# Patient Record
Sex: Female | Born: 1965
Health system: Southern US, Community
[De-identification: ages and names within clinical notes are randomized; demographics above are authoritative.]

## PROBLEM LIST (undated history)

## (undated) DIAGNOSIS — F329 Major depressive disorder, single episode, unspecified: Secondary | ICD-10-CM

## (undated) DIAGNOSIS — M1990 Unspecified osteoarthritis, unspecified site: Secondary | ICD-10-CM

## (undated) DIAGNOSIS — F419 Anxiety disorder, unspecified: Secondary | ICD-10-CM

## (undated) DIAGNOSIS — F32A Depression, unspecified: Secondary | ICD-10-CM

## (undated) HISTORY — DX: Anxiety disorder, unspecified: F41.9

## (undated) HISTORY — DX: Depression, unspecified: F32.A

## (undated) HISTORY — DX: Unspecified osteoarthritis, unspecified site: M19.90

## (undated) HISTORY — PX: ABDOMINAL HYSTERECTOMY: SHX81

## (undated) HISTORY — DX: Major depressive disorder, single episode, unspecified: F32.9

---

## 2002-10-06 ENCOUNTER — Encounter: Payer: Self-pay | Admitting: Family Medicine

## 2002-10-06 LAB — CONVERTED CEMR LAB

## 2005-07-14 ENCOUNTER — Ambulatory Visit: Payer: Self-pay | Admitting: Unknown Physician Specialty

## 2006-01-04 ENCOUNTER — Ambulatory Visit: Payer: Self-pay | Admitting: Family Medicine

## 2006-03-24 ENCOUNTER — Ambulatory Visit: Payer: Self-pay | Admitting: Family Medicine

## 2006-05-06 LAB — CONVERTED CEMR LAB: Pap Smear: NORMAL

## 2006-07-20 ENCOUNTER — Ambulatory Visit: Payer: Self-pay | Admitting: Unknown Physician Specialty

## 2006-08-10 ENCOUNTER — Encounter: Payer: Self-pay | Admitting: Family Medicine

## 2006-08-10 DIAGNOSIS — F32A Depression, unspecified: Secondary | ICD-10-CM | POA: Insufficient documentation

## 2006-08-10 DIAGNOSIS — F329 Major depressive disorder, single episode, unspecified: Secondary | ICD-10-CM

## 2006-08-10 DIAGNOSIS — F331 Major depressive disorder, recurrent, moderate: Secondary | ICD-10-CM | POA: Insufficient documentation

## 2006-08-10 DIAGNOSIS — F410 Panic disorder [episodic paroxysmal anxiety] without agoraphobia: Secondary | ICD-10-CM | POA: Insufficient documentation

## 2006-08-10 DIAGNOSIS — F411 Generalized anxiety disorder: Secondary | ICD-10-CM | POA: Insufficient documentation

## 2006-08-11 ENCOUNTER — Ambulatory Visit: Payer: Self-pay | Admitting: Family Medicine

## 2006-08-11 DIAGNOSIS — E785 Hyperlipidemia, unspecified: Secondary | ICD-10-CM | POA: Insufficient documentation

## 2006-11-30 ENCOUNTER — Ambulatory Visit: Payer: Self-pay | Admitting: Family Medicine

## 2006-12-05 LAB — CONVERTED CEMR LAB
ALT: 18 units/L (ref 0–35)
AST: 18 units/L (ref 0–37)
Triglycerides: 44 mg/dL (ref 0–149)
VLDL: 9 mg/dL (ref 0–40)

## 2007-09-26 ENCOUNTER — Telehealth (INDEPENDENT_AMBULATORY_CARE_PROVIDER_SITE_OTHER): Payer: Self-pay | Admitting: *Deleted

## 2007-10-01 ENCOUNTER — Ambulatory Visit: Payer: Self-pay | Admitting: Family Medicine

## 2007-10-03 ENCOUNTER — Encounter: Payer: Self-pay | Admitting: Family Medicine

## 2007-10-03 ENCOUNTER — Ambulatory Visit: Payer: Self-pay | Admitting: Family Medicine

## 2007-10-05 ENCOUNTER — Encounter (INDEPENDENT_AMBULATORY_CARE_PROVIDER_SITE_OTHER): Payer: Self-pay | Admitting: *Deleted

## 2007-10-05 ENCOUNTER — Encounter: Payer: Self-pay | Admitting: Family Medicine

## 2007-10-10 ENCOUNTER — Ambulatory Visit: Payer: Self-pay | Admitting: Family Medicine

## 2007-10-10 LAB — CONVERTED CEMR LAB
ALT: 15 units/L (ref 0–35)
Albumin: 4 g/dL (ref 3.5–5.2)
BUN: 9 mg/dL (ref 6–23)
CO2: 27 meq/L (ref 19–32)
Chloride: 103 meq/L (ref 96–112)
Cholesterol: 186 mg/dL (ref 0–200)
Creatinine, Ser: 0.9 mg/dL (ref 0.4–1.2)
GFR calc Af Amer: 88 mL/min
Glucose, Bld: 98 mg/dL (ref 70–99)
HDL: 57.4 mg/dL (ref >39.0)
Triglycerides: 48 mg/dL (ref 0–149)

## 2008-09-04 LAB — CONVERTED CEMR LAB: Pap Smear: NORMAL

## 2009-02-05 ENCOUNTER — Ambulatory Visit: Payer: Self-pay | Admitting: Family Medicine

## 2009-02-06 ENCOUNTER — Ambulatory Visit: Payer: Self-pay | Admitting: Family Medicine

## 2009-02-06 ENCOUNTER — Encounter: Payer: Self-pay | Admitting: Family Medicine

## 2009-02-09 LAB — CONVERTED CEMR LAB
ALT: 15 units/L (ref 0–35)
Basophils Relative: 0.5 % (ref 0.0–3.0)
Chloride: 108 meq/L (ref 96–112)
Cholesterol: 180 mg/dL (ref 0–200)
Eosinophils Absolute: 0.1 10*3/uL (ref 0.0–0.7)
Eosinophils Relative: 2 % (ref 0.0–5.0)
GFR calc non Af Amer: 64.06 mL/min (ref >60)
HCT: 35.1 % — ABNORMAL LOW (ref 36.0–46.0)
HDL: 60.6 mg/dL (ref >39.00)
Lymphs Abs: 2 10*3/uL (ref 0.7–4.0)
MCHC: 33.7 g/dL (ref 30.0–36.0)
MCV: 84.8 fL (ref 78.0–100.0)
Monocytes Absolute: 0.4 10*3/uL (ref 0.1–1.0)
Platelets: 348 10*3/uL (ref 150.0–400.0)
Potassium: 4.2 meq/L (ref 3.5–5.1)
Sodium: 141 meq/L (ref 135–145)
TSH: 1.27 microintl units/mL (ref 0.35–5.50)
Total Bilirubin: 0.8 mg/dL (ref 0.3–1.2)
Total Protein: 7 g/dL (ref 6.0–8.3)
VLDL: 10.6 mg/dL (ref 0.0–40.0)
WBC: 6 10*3/uL (ref 4.5–10.5)

## 2009-02-12 ENCOUNTER — Encounter (INDEPENDENT_AMBULATORY_CARE_PROVIDER_SITE_OTHER): Payer: Self-pay | Admitting: *Deleted

## 2009-06-17 ENCOUNTER — Ambulatory Visit: Payer: Self-pay | Admitting: Family Medicine

## 2009-06-17 DIAGNOSIS — J020 Streptococcal pharyngitis: Secondary | ICD-10-CM | POA: Insufficient documentation

## 2010-04-06 NOTE — Assessment & Plan Note (Signed)
Summary: ST/CLE   Vital Signs:  Patient profile:   45 year old female Height:      60.25 inches Weight:      152.25 pounds BMI:     29.59 Temp:     98.6 degrees F oral Pulse rate:   84 / minute Pulse rhythm:   regular BP sitting:   132 / 82  (left arm) Cuff size:   regular  Vitals Entered By: Delilah Shan CMA Duncan Dull) (June 17, 2009 2:27 PM) CC: ST   History of Present Illness: 45 yo with 3 days of sore throat. Felt feverish last night. No cough, runny nose, n/v/d, or other symptoms. 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   Daughter has had a sore throat this week as well.  Current Medications (verified): 1)  Paxil 40 Mg Tabs (Paroxetine Hcl) .Marland Kitchen.. 1 By Mouth Once Daily 2)  Afrin Nasal Spray 0.05 % Soln (Oxymetazoline Hcl) .... Use As Directed Prn 3)  Penicillin V Potassium 500 Mg Tabs (Penicillin V Potassium) .Marland Kitchen.. 1 Tab By Mouth Three Times A Day X 10 Days  Allergies (verified): No Known Drug Allergies  Review of Systems      See HPI General:  Complains of fever. ENT:  Complains of sore throat. Resp:  Denies cough. GI:  Denies diarrhea, nausea, and vomiting.  Physical Exam  General:  overweight but generally well appearing  Ears:  R ear normal and L ear normal.   Mouth:  pharyngeal erythema.   white exudates Lungs:  Normal respiratory effort, chest expands symmetrically. Lungs are clear to auscultation, no crackles or wheezes. Heart:  Normal rate and regular rhythm. S1 and S2 normal without gallop, murmur, click, rub or other extra sounds. Cervical Nodes:  R anterior LN tender.     Impression & Recommendations:  Problem # 1:  STREPTOCOCCAL SORE THROAT (ICD-034.0) Assessment New rapid strep positive with correlating symptoms and physical exam findings. PCN 1 tab by mouth three times a day x 10 days. Ibuprofen as needed pain. Her updated medication list for this problem includes:    Penicillin V Potassium 500 Mg Tabs (Penicillin v potassium)  .Marland Kitchen... 1 tab by mouth three times a day x 10 days  Complete Medication List: 1)  Paxil 40 Mg Tabs (Paroxetine hcl) .Marland Kitchen.. 1 by mouth once daily 2)  Afrin Nasal Spray 0.05 % Soln (Oxymetazoline hcl) .... Use as directed prn 3)  Penicillin V Potassium 500 Mg Tabs (Penicillin v potassium) .Marland Kitchen.. 1 tab by mouth three times a day x 10 days Prescriptions: PENICILLIN V POTASSIUM 500 MG TABS (PENICILLIN V POTASSIUM) 1 tab by mouth three times a day x 10 days  #30 x 0   Entered and Authorized by:   Ruthe Mannan MD   Signed by:   Ruthe Mannan MD on 06/17/2009   Method used:   Electronically to        [facility]. [address] [phone]* (retail)       [address] [place], [place]  [id number]       Ph: [phone]       Fax: [phone]   RxID:   [id number]   Current Allergies (reviewed today): No known allergies   Laboratory Results   Date/Time Reported: June 17, 2009 2:38 PM   Other Tests  Rapid Strep: positive

## 2010-06-02 ENCOUNTER — Ambulatory Visit: Payer: Self-pay | Admitting: Family Medicine

## 2011-08-11 ENCOUNTER — Ambulatory Visit: Payer: Self-pay | Admitting: Family Medicine

## 2011-09-28 ENCOUNTER — Ambulatory Visit: Payer: Self-pay | Admitting: Family Medicine

## 2013-01-14 ENCOUNTER — Ambulatory Visit: Payer: Self-pay | Admitting: Obstetrics and Gynecology

## 2013-01-14 LAB — COMPREHENSIVE METABOLIC PANEL
Albumin: 4 g/dL (ref 3.4–5.0)
Chloride: 105 mmol/L (ref 98–107)
Co2: 28 mmol/L (ref 21–32)
EGFR (Non-African Amer.): >60
Glucose: 89 mg/dL (ref 65–99)
Potassium: 4 mmol/L (ref 3.5–5.1)
SGOT(AST): 17 U/L (ref 15–37)
Total Protein: 7.5 g/dL (ref 6.4–8.2)

## 2013-01-14 LAB — CBC
HCT: 36.3 % (ref 35.0–47.0)
HGB: 12.3 g/dL (ref 12.0–16.0)
MCHC: 33.8 g/dL (ref 32.0–36.0)
RBC: 4.51 10*6/uL (ref 3.80–5.20)
WBC: 7.6 10*3/uL (ref 3.6–11.0)

## 2013-01-14 LAB — PREGNANCY, URINE: Pregnancy Test, Urine: NEGATIVE m[IU]/mL

## 2013-01-25 ENCOUNTER — Ambulatory Visit: Payer: Self-pay | Admitting: Obstetrics and Gynecology

## 2013-01-26 LAB — BASIC METABOLIC PANEL
Anion Gap: 4 — ABNORMAL LOW (ref 7–16)
BUN: 7 mg/dL (ref 7–18)
Glucose: 109 mg/dL — ABNORMAL HIGH (ref 65–99)

## 2013-01-26 LAB — CBC
MCHC: 33.2 g/dL (ref 32.0–36.0)
RDW: 13.2 % (ref 11.5–14.5)

## 2014-06-27 NOTE — Op Note (Signed)
PATIENT NAME:  Kara Mayer, [name] MR#:  [id number] DATE OF BIRTH:  Sep 14, 1965  DATE OF PROCEDURE:  01/25/2013  PREOPERATIVE DIAGNOSES:  Abnormal uterine bleeding - not otherwise specified.   PREOPERATIVE DIAGNOSIS:  Abnormal uterine bleeding - not otherwise specified.   PROCEDURES:  1.  Total laparoscopic hysterectomy.  2.  Bilateral salpingectomy.  3.  Video cystourethroscopy.   ANESTHESIA:  General.   SURGEON:  Conard NovakStephen D. Jackson, M.D.   ASSISTANT:   Annamarie MajorPaul Harris, M.D.   ESTIMATED BLOOD LOSS:  50 mL.  OPERATIVE FLUIDS:  1000 mL crystalloid.   COMPLICATIONS:  None.   FINDINGS:  1.  Normal-appearing uterus.  2.  Left fallopian tube and simple paratubal cyst.  3.  Simple-appearing cyst on left ovary.  4.  Normal-appearing right fallopian tube and ovary.   SPECIMENS:  1.  Uterus and cervix.  2.  Left and right fallopian tubes.  CONDITION AT END OF PROCEDURE:  Stable.   PROCEDURE IN DETAIL:  The patient was taken to the Operating Room where general anesthetic was administered and found to be adequate. She was placed in the dorsal supine position in the [place] stirrups and prepped and draped in the usual sterile fashion. After time-out was called, a Foley catheter was placed in her bladder and a sterile speculum was placed in the vagina and a single-tooth tenaculum was affixed to the anterior lip of the cervix. Next, a large V-Care device was affixed the cervix according to the manufacturer's recommendations after removal of the single-tooth tenaculum. The vaginal occluder balloon was also fixed to the V-Care device. After assurance the V-Care device was affixed correctly and the [facility] ring was at the vaginal fornices, the attention was then turned to the abdomen.   Attention was turned to the abdomen where a 5-mm infraumbilical incision was made and the abdomen was entered using direct visualization through an Optiview trocar technique. Entry into the abdomen was verified using opening  pressures. After insufflation of the abdomen with CO2, the camera was reintroduced through the trocar and verification atraumatic entry was undertaken and verified. The abdominal and pelvic survey was undertaken with the above-noted findings. A left lower quadrant 5-mm port was placed under direct intra-abdominal camera visualization without difficulty. A right 11-mm port was placed in the same fashion under direct intra-abdominal camera visualization. Next, the right and left ureters were identified and found to be well away from the fallopian tubes and well away from the IP ligaments. The right fallopian tube was then removed along the mesosalpinx using the LigaSure device and then the right fallopian tube was removed through the right lower quadrant trocar port. In a similar fashion, the left fallopian tube was removed after verifying the ureter was well away from the operative area of interest. The left fallopian tube was also removed through the trocar port. Attention was turned to the right adnexa where transection of the round ligament followed by the anterior broad ligament to the level of the internal os was undertaken. A bladder flap was created. The posterior broad ligament was then opened using the Harmonic device and the right uterine artery was skeletonized and at the level of the internal os it was bipolar electrocauterized using the Kleppinger device. This was then transected using the Harmonic scalpel. Attention was turned to the left adnexa where the exact same procedure was carried out on the left side without difficulty. With good effort at pushing inward on the [place] ring and V-Care device. The colpotomy was made.  This was also accomplished after insufflation of the vaginal occluder device. After the cervix was completely excised using the [facility] ring, the uterus was then removed through the vagina without difficulty. Next, the vaginal cuff was closed using #0 Vicryl using the Endo Stitch and  extracorporeal knot tying technique. Approximately 6 sutures were thrown and the vagina was digitally tested for integrity and no leakage of gas. Extra care was taken to include the vaginal epithelium.   Cystoscopy was undertaken to verify the efflux of urine from the ureters. Both ureters were noted to have efflux of yellow urine from each ureteral orifice. The rest of the bladder was inspected without any damage including the dome as well as the trigone and the lateral sides of the bladder, and the cystoscope was gently retracted to ensure that the urethra also had no evident damage. Next, the Foley catheter was replaced, and inspection again was undertaken in the abdomen and pelvis as pressure was released, there was hemostasis.   After desufflation of CO2. all port sites were removed and each skin incision was closed using a single 3-0 Vicryl on the subcutaneous area and the skin was reapproximated using Dermabond. A total of 20 mL local anesthetic was used for pain control.   The patient tolerated the procedure well. Sponge, lap, and needle counts were correct x 2. The patient received 2 grams of Ancef prior to skin incision. For VTE prophylaxis, she was wearing pneumatic compression stockings that were on and operating throughout the entire surgery. At the end of the procedure, the vagina was inspected and found to be free of any instrumentation and laparotomy sponges or anything else. The patient was awakened in the Operating Room and taken to the Recovery area in stable condition.   ____________________________ Conard Novak, MD sdj:jm D: 01/25/2013 14:59:45 ET T: 01/25/2013 15:50:42 ET JOB#: [id number]  cc: Conard Novak, MD, <Dictator> Conard Novak MD ELECTRONICALLY SIGNED 01/29/2013 18:41

## 2014-09-17 ENCOUNTER — Encounter: Payer: Self-pay | Admitting: Family Medicine

## 2014-09-17 ENCOUNTER — Ambulatory Visit (INDEPENDENT_AMBULATORY_CARE_PROVIDER_SITE_OTHER): Payer: Managed Care, Other (non HMO) | Admitting: Family Medicine

## 2014-09-17 VITALS — BP 118/70 | HR 77 | Temp 98.1°F | Ht 60.0 in | Wt 166.5 lb

## 2014-09-17 DIAGNOSIS — F329 Major depressive disorder, single episode, unspecified: Secondary | ICD-10-CM

## 2014-09-17 DIAGNOSIS — F32A Depression, unspecified: Secondary | ICD-10-CM

## 2014-09-17 DIAGNOSIS — E785 Hyperlipidemia, unspecified: Secondary | ICD-10-CM

## 2014-09-17 DIAGNOSIS — F410 Panic disorder [episodic paroxysmal anxiety] without agoraphobia: Secondary | ICD-10-CM | POA: Diagnosis not present

## 2014-09-17 DIAGNOSIS — F411 Generalized anxiety disorder: Secondary | ICD-10-CM

## 2014-09-17 NOTE — Addendum Note (Signed)
Addended by: Dianne DunARON, Tionne Dayhoff M on: 09/17/2014 11:24 AM   Modules accepted: Level of Service

## 2014-09-17 NOTE — Progress Notes (Signed)
   Subjective:   Patient ID: Kara SenterJulie P Phenix, female    DOB: 07-28-1965, 49 y.o.   MRN: [id number]  Kara Mayer is a pleasant 49 y.o. year old female who presents to clinic today with Establish Care  on 09/17/2014  HPI:  Has been seeing Dr. Dear.  Last saw her in fall of 2015- per pt GYN exam and mammogram UTD- awaiting records. Remote h/o hysterectomy.  Does still have ovaries.  Per pt, labs were done in 02/2014.  Anxiety/depression- symptoms have been well controlled on zoloft 100 mg daily.  Takes prn xanax- "maybe 2 or 3 times a month."  Denies any current symptoms of anxiety or depression. Appetite good.  Sleeps ok but has had some difficulty when work is very stressful.  No current outpatient prescriptions on file prior to visit.   No current facility-administered medications on file prior to visit.    No Known Allergies  No past medical history on file.  No past surgical history on file.  No family history on file.  History   Social History  . Marital Status: Married    Spouse Name: N/A  . Number of Children: N/A  . Years of Education: N/A   Occupational History  . Not on file.   Social History Main Topics  . Smoking status: Never Smoker   . Smokeless tobacco: Never Used  . Alcohol Use: 0.0 oz/week    0 Standard drinks or equivalent per week     Comment: on weekends  . Drug Use: No  . Sexual Activity: Not on file   Other Topics Concern  . Not on file   Social History Narrative  . No narrative on file   The PMH, PSH, Social History, Family History, Medications, and allergies have been reviewed in [facility], and have been updated if relevant.   Review of Systems  Constitutional: Negative.   HENT: Negative.   Eyes: Negative.   Respiratory: Negative.   Cardiovascular: Negative.   Gastrointestinal: Negative.   Endocrine: Negative.   Genitourinary: Negative.   Musculoskeletal: Negative.   Skin: Negative.   Allergic/Immunologic: Negative.   Neurological:  Negative.   Hematological: Negative.   Psychiatric/Behavioral: Negative.   All other systems reviewed and are negative.      Objective:    BP 118/70 mmHg  Pulse 77  Temp(Src) 98.1 F (36.7 C) (Oral)  Ht 5' (1.524 m)  Wt 166 lb 8 oz (75.524 kg)  BMI 32.52 kg/m2   Physical Exam  Constitutional: She is oriented to person, place, and time. She appears well-developed and well-nourished. No distress.  HENT:  Head: Normocephalic.  Eyes: Conjunctivae are normal.  Neck: Normal range of motion.  Cardiovascular: Normal rate, regular rhythm and normal heart sounds.   Pulmonary/Chest: Effort normal and breath sounds normal.  Musculoskeletal: She exhibits no edema.  Neurological: She is alert and oriented to person, place, and time. No cranial nerve deficit.  Skin: Skin is warm and dry.  Psychiatric: She has a normal mood and affect. Her behavior is normal. Thought content normal.  Nursing note and vitals reviewed.         Assessment & Plan:   PANIC DISORDER  HLD (hyperlipidemia)  Depression  Anxiety state No Follow-up on file.

## 2014-09-17 NOTE — Progress Notes (Signed)
Pre visit review using our clinic review tool, if applicable. No additional management support is needed unless otherwise documented below in the visit note. 

## 2014-09-17 NOTE — Assessment & Plan Note (Signed)
>>  ASSESSMENT AND PLAN FOR ANXIETY STATE WRITTEN ON 09/17/2014 11:24 AM BY Dianne Dun, MD  >25 minutes spent in face to face time with patient, >50% spent in counselling or coordination of care  PMH- anxiety, depression, HLD. Doing well on current rxs.  No changes made today.  Awaiting records.

## 2014-09-17 NOTE — Patient Instructions (Signed)
It was nice to meet you.  Once I have your records, we can decide when you are do for your physical.

## 2014-09-17 NOTE — Assessment & Plan Note (Addendum)
>  25 minutes spent in face to face time with patient, >50% spent in counselling or coordination of care  PMH- anxiety, depression, HLD. Doing well on current rxs.  No changes made today.  Awaiting records.

## 2015-01-12 ENCOUNTER — Other Ambulatory Visit: Payer: Self-pay | Admitting: *Deleted

## 2015-01-12 MED ORDER — SERTRALINE HCL 100 MG PO TABS: 100.0000 mg | ORAL_TABLET | Freq: Every day | ORAL | Status: DC

## 2015-01-12 NOTE — Telephone Encounter (Signed)
Pt recently established care 09/2014

## 2015-01-19 ENCOUNTER — Other Ambulatory Visit: Payer: Self-pay | Admitting: Family Medicine

## 2015-01-19 DIAGNOSIS — Z1231 Encounter for screening mammogram for malignant neoplasm of breast: Secondary | ICD-10-CM

## 2015-02-13 ENCOUNTER — Ambulatory Visit
Admission: RE | Admit: 2015-02-13 | Discharge: 2015-02-13 | Disposition: A | Discharge status: Home / Self Care | Payer: Managed Care, Other (non HMO) | Source: Ambulatory Visit | Attending: Family Medicine | Admitting: Family Medicine

## 2015-02-13 DIAGNOSIS — Z1231 Encounter for screening mammogram for malignant neoplasm of breast: Secondary | ICD-10-CM | POA: Diagnosis not present

## 2015-02-13 DIAGNOSIS — R921 Mammographic calcification found on diagnostic imaging of breast: Secondary | ICD-10-CM | POA: Insufficient documentation

## 2015-02-16 ENCOUNTER — Other Ambulatory Visit: Payer: Self-pay | Admitting: Family Medicine

## 2015-02-16 DIAGNOSIS — N63 Unspecified lump in unspecified breast: Secondary | ICD-10-CM

## 2015-02-16 DIAGNOSIS — R928 Other abnormal and inconclusive findings on diagnostic imaging of breast: Secondary | ICD-10-CM

## 2015-02-16 DIAGNOSIS — R921 Mammographic calcification found on diagnostic imaging of breast: Secondary | ICD-10-CM

## 2015-03-31 ENCOUNTER — Ambulatory Visit (INDEPENDENT_AMBULATORY_CARE_PROVIDER_SITE_OTHER): Payer: Managed Care, Other (non HMO) | Admitting: Primary Care

## 2015-03-31 ENCOUNTER — Encounter: Payer: Self-pay | Admitting: Primary Care

## 2015-03-31 ENCOUNTER — Other Ambulatory Visit: Payer: Self-pay | Admitting: Family Medicine

## 2015-03-31 VITALS — BP 134/82 | HR 88 | Temp 98.0°F | Ht 60.0 in | Wt 168.8 lb

## 2015-03-31 DIAGNOSIS — R05 Cough: Secondary | ICD-10-CM | POA: Diagnosis not present

## 2015-03-31 DIAGNOSIS — R059 Cough, unspecified: Secondary | ICD-10-CM

## 2015-03-31 MED ORDER — AZITHROMYCIN 250 MG PO TABS: ORAL_TABLET | ORAL | Status: DC

## 2015-03-31 MED ORDER — SERTRALINE HCL 100 MG PO TABS: 100.0000 mg | ORAL_TABLET | Freq: Every day | ORAL | Status: DC

## 2015-03-31 MED ORDER — ALPRAZOLAM 0.25 MG PO TABS: ORAL_TABLET | ORAL | Status: DC

## 2015-03-31 MED ORDER — BENZONATATE 200 MG PO CAPS: 200.0000 mg | ORAL_CAPSULE | Freq: Three times a day (TID) | ORAL | Status: DC | PRN

## 2015-03-31 NOTE — Telephone Encounter (Signed)
Pt has not seen Dr Dayton Martes since establishing in 09/2014

## 2015-03-31 NOTE — Progress Notes (Signed)
   Subjective:    Patient ID: Kara Mayer, female    DOB: 05/09/1965, 50 y.o.   MRN: [id number]  HPI  Kara Mayer is a 50 year old female who presents today with a chief complaint of cough. She also reports fever, headache, chills, nasal congestion. Her symptoms have been present for the past 3 days. She noticed sinus pressure since Wednesday last week which has since improved. Her cough is non productive. She's taken sudafed, Robitussin, Mucinex severe cold, and motrin without improvement. Her most bothersome symptom is the cough. Denies sick contacts.  Review of Systems  Constitutional: Positive for fever, chills and fatigue.  HENT: Positive for congestion and sore throat. Negative for ear pain.   Respiratory: Positive for cough and shortness of breath. Negative for wheezing.   Cardiovascular: Negative for chest pain.  Gastrointestinal: Negative for nausea.  Musculoskeletal: Positive for myalgias.  Neurological: Positive for headaches.       No past medical history on file.  Social History   Social History  . Marital Status: Married    Spouse Name: N/A  . Number of Children: N/A  . Years of Education: N/A   Occupational History  . Not on file.   Social History Main Topics  . Smoking status: Never Smoker   . Smokeless tobacco: Never Used  . Alcohol Use: 0.0 oz/week    0 Standard drinks or equivalent per week     Comment: on weekends  . Drug Use: No  . Sexual Activity: Not on file   Other Topics Concern  . Not on file   Social History Narrative    No past surgical history on file.  No family history on file.  No Known Allergies  No current outpatient prescriptions on file prior to visit.   No current facility-administered medications on file prior to visit.    BP 134/82 mmHg  Pulse 88  Temp(Src) 98 F (36.7 C) (Oral)  Ht 5' (1.524 m)  Wt 168 lb 12.8 oz (76.567 kg)  BMI 32.97 kg/m2  SpO2 95%    Objective:   Physical Exam  Constitutional: She  appears well-nourished.  HENT:  Right Ear: Tympanic membrane and ear canal normal.  Left Ear: Tympanic membrane and ear canal normal.  Nose: Right sinus exhibits no maxillary sinus tenderness and no frontal sinus tenderness. Left sinus exhibits no maxillary sinus tenderness and no frontal sinus tenderness.  Mouth/Throat: Oropharynx is clear and moist.  Neck: Neck supple.  Cardiovascular: Normal rate and regular rhythm.   Pulmonary/Chest: Effort normal. She has no decreased breath sounds. She has no wheezes.  Crackles to left base  Skin: Skin is warm and dry.          Assessment & Plan:  Cough:  Cough x 3 days with fatigue, fevers, SOB. Sinus symptoms 1 week ago. Now feeling worse. Exam with crackles to left lower lobe, appears tired.  Due to fevers over 101, crackles to lung base, and fatigue, will treat for possible bacterial involvement. RX for Zpak and tessalon pearls sent to pharmacy. Fluids, rest, Nyquil HS. Return precautions provided.

## 2015-03-31 NOTE — Telephone Encounter (Signed)
Pt needs refill on Zoloft and Xanax. Please advise  [facility] on [address] in [place]  [phone]

## 2015-03-31 NOTE — Telephone Encounter (Signed)
Please enter as refill request. 

## 2015-03-31 NOTE — Progress Notes (Signed)
Pre visit review using our clinic review tool, if applicable. No additional management support is needed unless otherwise documented below in the visit note. 

## 2015-03-31 NOTE — Patient Instructions (Signed)
Start Azithromycin antibiotics. Take 2 tablets by mouth today, then 1 tablet daily for 4 additional days.  You may take Benzonatate capsules for cough. Take 1 capsule by mouth three times daily as needed for cough.  Ensure that you are staying hydrated with water. Continue Mucinex for any chest congestion, ibuprofen for headaches/fevers.  Please call me if no improvement in 3-4 days or if you symptoms become worse.  It was a pleasure meeting you!

## 2015-03-31 NOTE — Telephone Encounter (Signed)
Rx called in to requested pharmacy 

## 2015-04-09 ENCOUNTER — Ambulatory Visit
Admission: RE | Admit: 2015-04-09 | Discharge: 2015-04-09 | Disposition: A | Discharge status: Home / Self Care | Payer: Managed Care, Other (non HMO) | Source: Ambulatory Visit | Attending: Family Medicine | Admitting: Family Medicine

## 2015-04-09 DIAGNOSIS — N63 Unspecified lump in unspecified breast: Secondary | ICD-10-CM

## 2015-04-09 DIAGNOSIS — R928 Other abnormal and inconclusive findings on diagnostic imaging of breast: Secondary | ICD-10-CM

## 2015-04-09 DIAGNOSIS — R921 Mammographic calcification found on diagnostic imaging of breast: Secondary | ICD-10-CM

## 2015-07-23 ENCOUNTER — Other Ambulatory Visit: Payer: Self-pay | Admitting: Orthopedic Surgery

## 2015-07-23 DIAGNOSIS — M5416 Radiculopathy, lumbar region: Secondary | ICD-10-CM

## 2015-07-23 DIAGNOSIS — M4726 Other spondylosis with radiculopathy, lumbar region: Secondary | ICD-10-CM

## 2015-07-31 ENCOUNTER — Ambulatory Visit: Payer: Managed Care, Other (non HMO)

## 2015-12-21 ENCOUNTER — Other Ambulatory Visit: Payer: Self-pay | Admitting: Orthopedic Surgery

## 2015-12-21 DIAGNOSIS — M4726 Other spondylosis with radiculopathy, lumbar region: Secondary | ICD-10-CM

## 2015-12-21 DIAGNOSIS — M5416 Radiculopathy, lumbar region: Secondary | ICD-10-CM

## 2015-12-31 ENCOUNTER — Encounter (INDEPENDENT_AMBULATORY_CARE_PROVIDER_SITE_OTHER): Payer: Self-pay

## 2015-12-31 ENCOUNTER — Ambulatory Visit
Admission: RE | Admit: 2015-12-31 | Discharge: 2015-12-31 | Disposition: A | Discharge status: Home / Self Care | Payer: 59 | Source: Ambulatory Visit | Attending: Orthopedic Surgery | Admitting: Orthopedic Surgery

## 2015-12-31 DIAGNOSIS — M4726 Other spondylosis with radiculopathy, lumbar region: Secondary | ICD-10-CM | POA: Insufficient documentation

## 2015-12-31 DIAGNOSIS — M5416 Radiculopathy, lumbar region: Secondary | ICD-10-CM

## 2015-12-31 DIAGNOSIS — M5116 Intervertebral disc disorders with radiculopathy, lumbar region: Secondary | ICD-10-CM | POA: Insufficient documentation

## 2016-02-24 ENCOUNTER — Other Ambulatory Visit: Payer: Self-pay | Admitting: Family Medicine

## 2016-02-24 DIAGNOSIS — E785 Hyperlipidemia, unspecified: Secondary | ICD-10-CM

## 2016-02-24 DIAGNOSIS — Z01419 Encounter for gynecological examination (general) (routine) without abnormal findings: Secondary | ICD-10-CM | POA: Insufficient documentation

## 2016-03-01 ENCOUNTER — Other Ambulatory Visit (INDEPENDENT_AMBULATORY_CARE_PROVIDER_SITE_OTHER): Payer: 59

## 2016-03-01 DIAGNOSIS — Z01419 Encounter for gynecological examination (general) (routine) without abnormal findings: Secondary | ICD-10-CM | POA: Diagnosis not present

## 2016-03-01 LAB — CBC WITH DIFFERENTIAL/PLATELET
BASOS PCT: 0.4 % (ref 0.0–3.0)
Basophils Absolute: 0 10*3/uL (ref 0.0–0.1)
EOS ABS: 0.2 10*3/uL (ref 0.0–0.7)
Eosinophils Relative: 3.5 % (ref 0.0–5.0)
HEMATOCRIT: 36.6 % (ref 36.0–46.0)
HEMOGLOBIN: 12.4 g/dL (ref 12.0–15.0)
LYMPHS PCT: 35.8 % (ref 12.0–46.0)
Lymphs Abs: 2.4 10*3/uL (ref 0.7–4.0)
MCHC: 33.8 g/dL (ref 30.0–36.0)
MCV: 80.6 fl (ref 78.0–100.0)
Monocytes Absolute: 0.4 10*3/uL (ref 0.1–1.0)
Monocytes Relative: 5.7 % (ref 3.0–12.0)
NEUTROS ABS: 3.7 10*3/uL (ref 1.4–7.7)
Neutrophils Relative %: 54.6 % (ref 43.0–77.0)
PLATELETS: 369 10*3/uL (ref 150.0–400.0)
RBC: 4.55 Mil/uL (ref 3.87–5.11)
RDW: 13.5 % (ref 11.5–15.5)
WBC: 6.8 10*3/uL (ref 4.0–10.5)

## 2016-03-01 LAB — COMPREHENSIVE METABOLIC PANEL
ALBUMIN: 4.2 g/dL (ref 3.5–5.2)
ALT: 18 U/L (ref 0–35)
AST: 25 U/L (ref 0–37)
Alkaline Phosphatase: 58 U/L (ref 39–117)
BUN: 16 mg/dL (ref 6–23)
CALCIUM: 9 mg/dL (ref 8.4–10.5)
CHLORIDE: 105 meq/L (ref 96–112)
CO2: 23 meq/L (ref 19–32)
CREATININE: 0.9 mg/dL (ref 0.40–1.20)
GFR: 70.18 mL/min (ref >60.00)
Glucose, Bld: 105 mg/dL — ABNORMAL HIGH (ref 70–99)
Sodium: 140 mEq/L (ref 135–145)
Total Bilirubin: 0.3 mg/dL (ref 0.2–1.2)
Total Protein: 7.2 g/dL (ref 6.0–8.3)

## 2016-03-01 LAB — LIPID PANEL
CHOL/HDL RATIO: 4
CHOLESTEROL: 190 mg/dL (ref 0–200)
HDL: 53 mg/dL (ref >39.00)
LDL CALC: 116 mg/dL — AB (ref 0–99)
NonHDL: 136.83
TRIGLYCERIDES: 106 mg/dL (ref 0.0–149.0)
VLDL: 21.2 mg/dL (ref 0.0–40.0)

## 2016-03-01 LAB — TSH: TSH: 2.9 u[IU]/mL (ref 0.35–4.50)

## 2016-03-07 HISTORY — PX: REDUCTION MAMMAPLASTY: SUR839

## 2016-03-08 ENCOUNTER — Ambulatory Visit (INDEPENDENT_AMBULATORY_CARE_PROVIDER_SITE_OTHER): Payer: 59 | Admitting: Family Medicine

## 2016-03-08 ENCOUNTER — Encounter: Payer: Self-pay | Admitting: Family Medicine

## 2016-03-08 VITALS — BP 136/82 | HR 72 | Temp 98.5°F | Ht 60.0 in | Wt 170.2 lb

## 2016-03-08 DIAGNOSIS — F410 Panic disorder [episodic paroxysmal anxiety] without agoraphobia: Secondary | ICD-10-CM

## 2016-03-08 DIAGNOSIS — Z01419 Encounter for gynecological examination (general) (routine) without abnormal findings: Secondary | ICD-10-CM

## 2016-03-08 DIAGNOSIS — M5136 Other intervertebral disc degeneration, lumbar region: Secondary | ICD-10-CM

## 2016-03-08 DIAGNOSIS — Z0001 Encounter for general adult medical examination with abnormal findings: Secondary | ICD-10-CM

## 2016-03-08 DIAGNOSIS — F411 Generalized anxiety disorder: Secondary | ICD-10-CM

## 2016-03-08 DIAGNOSIS — E785 Hyperlipidemia, unspecified: Secondary | ICD-10-CM

## 2016-03-08 MED ORDER — SERTRALINE HCL 100 MG PO TABS: 100.0000 mg | ORAL_TABLET | Freq: Every day | ORAL | 3 refills | Status: DC

## 2016-03-08 MED ORDER — ALPRAZOLAM 0.25 MG PO TABS: ORAL_TABLET | ORAL | 0 refills | Status: DC

## 2016-03-08 NOTE — Patient Instructions (Signed)
Great to see you. Happy New Year!  Please call to schedule your mammogram.  Please consider colonscopy.

## 2016-03-08 NOTE — Assessment & Plan Note (Signed)
>>  ASSESSMENT AND PLAN FOR ANXIETY STATE WRITTEN ON 03/08/2016  9:34 AM BY Dianne Dun, MD  Deteriorated because ran out of zoloft. eRx refills sent. Rx for xanax printed and given to pt.

## 2016-03-08 NOTE — Progress Notes (Signed)
Pre visit review using our clinic review tool, if applicable. No additional management support is needed unless otherwise documented below in the visit note. 

## 2016-03-08 NOTE — Assessment & Plan Note (Signed)
Reviewed preventive care protocols, scheduled due services, and updated immunizations Discussed nutrition, exercise, diet, and healthy lifestyle.   Discussed colonoscopy - pt will think about it and get back to me.

## 2016-03-08 NOTE — Assessment & Plan Note (Signed)
Deteriorated because ran out of zoloft. eRx refills sent. Rx for xanax printed and given to pt.

## 2016-03-08 NOTE — Progress Notes (Signed)
Subjective:   Patient ID: Kara Mayer, female    DOB: 1965/12/10, 51 y.o.   MRN: [id number]  Kara Mayer is a pleasant 51 y.o. year old female who presents to clinic today with Annual Exam and Back Pain (sees ortho)  and follow up of chronic medical conditions on 03/08/2016  HPI:  Remote h/o hysterectomy.  Mammogram 04/09/15  DDD of spine- followed by Dr. Rosita Kea.  Last saw him on 01/26/16. Notes reviewed. Given an rx for tizanidine and referred to PT. Saw Dr. Yves Dill on 02/10/16.  Notes reviewed.  Given epidural injections at that OV. She is scheduled to have another injection this Friday.  Anxiety/depression- symptoms had been well controlled on zoloft 100 mg dail but ran out a few weeks ago.  Has been much more tearful. .  Takes prn xanax- "maybe 2 or 3 times a month."  Denies any current symptoms of anxiety or depression. Appetite good.  Sleeps ok but has had some difficulty when work is very stressful.  Lab Results  Component Value Date   CHOL 190 03/01/2016   HDL 53.00 03/01/2016   LDLCALC 116 (H) 03/01/2016   TRIG 106.0 03/01/2016   CHOLHDL 4 03/01/2016   Lab Results  Component Value Date   CREATININE 0.90 03/01/2016   Lab Results  Component Value Date   TSH 2.90 03/01/2016   Lab Results  Component Value Date   WBC 6.8 03/01/2016   HGB 12.4 03/01/2016   HCT 36.6 03/01/2016   MCV 80.6 03/01/2016   PLT 369.0 03/01/2016   Lab Results  Component Value Date   NA 140 03/01/2016   K 4.2 Hemolyzed 03/01/2016   CL 105 03/01/2016   CO2 23 03/01/2016   Lab Results  Component Value Date   ALT 18 03/01/2016   AST 25 03/01/2016   ALKPHOS 58 03/01/2016   BILITOT 0.3 03/01/2016   No current outpatient prescriptions on file prior to visit.   No current facility-administered medications on file prior to visit.     No Known Allergies  No past medical history on file.  No past surgical history on file.  No family history on file.  Social History   Social  History  . Marital status: Married    Spouse name: N/A  . Number of children: N/A  . Years of education: N/A   Occupational History  . Not on file.   Social History Main Topics  . Smoking status: Never Smoker  . Smokeless tobacco: Never Used  . Alcohol use 0.0 oz/week     Comment: on weekends  . Drug use: No  . Sexual activity: Not on file   Other Topics Concern  . Not on file   Social History Narrative  . No narrative on file   The PMH, PSH, Social History, Family History, Medications, and allergies have been reviewed in [facility], and have been updated if relevant.   Review of Systems  Constitutional: Negative.   HENT: Negative.   Eyes: Negative.   Respiratory: Negative.   Cardiovascular: Negative.   Gastrointestinal: Negative.   Endocrine: Negative.   Genitourinary: Negative.   Musculoskeletal: Positive for back pain. Negative for gait problem, joint swelling and myalgias.  Skin: Negative.   Allergic/Immunologic: Negative.   Neurological: Negative.   Hematological: Negative.   Psychiatric/Behavioral: Positive for dysphoric mood. The patient is nervous/anxious.   All other systems reviewed and are negative.      Objective:    BP 136/82   Pulse  72   Temp 98.5 F (36.9 C) (Oral)   Ht 5' (1.524 m)   Wt 170 lb 4 oz (77.2 kg)   SpO2 99%   BMI 33.25 kg/m    Physical Exam   General:  Well-developed,well-nourished,in no acute distress; alert,appropriate and cooperative throughout examination Head:  normocephalic and atraumatic.   Eyes:  vision grossly intact, PERRL Ears:  R ear normal and L ear normal externally, TMs clear bilaterally Nose:  no external deformity.   Mouth:  good dentition.   Neck:  No deformities, masses, or tenderness noted. Breasts:  No mass, nodules, thickening, tenderness, bulging, retraction, inflamation, nipple discharge or skin changes noted.   Lungs:  Normal respiratory effort, chest expands symmetrically. Lungs are clear to  auscultation, no crackles or wheezes. Heart:  Normal rate and regular rhythm. S1 and S2 normal without gallop, murmur, click, rub or other extra sounds. Abdomen:  Bowel sounds positive,abdomen soft and non-tender without masses, organomegaly or hernias noted. Msk:  No deformity or scoliosis noted of thoracic or lumbar spine.   Extremities:  No clubbing, cyanosis, edema, or deformity noted with normal full range of motion of all joints.   Neurologic:  alert & oriented X3 and gait normal.   Skin:  Intact without suspicious lesions or rashes Cervical Nodes:  No lymphadenopathy noted Axillary Nodes:  No palpable lymphadenopathy Psych:  Cognition and judgment appear intact. Alert and cooperative with normal attention span and concentration. No apparent delusions, illusions, hallucinations       Assessment & Plan:   Well woman exam  PANIC DISORDER  Hyperlipidemia, unspecified hyperlipidemia type  Anxiety state  DDD (degenerative disc disease), lumbar No Follow-up on file.

## 2016-03-08 NOTE — Assessment & Plan Note (Signed)
Followed by Dr. Rosita KeaMenz and Dr. Yves Dillhasnis.

## 2016-03-11 DIAGNOSIS — M5136 Other intervertebral disc degeneration, lumbar region: Secondary | ICD-10-CM | POA: Diagnosis not present

## 2016-03-11 DIAGNOSIS — M5416 Radiculopathy, lumbar region: Secondary | ICD-10-CM | POA: Diagnosis not present

## 2016-03-15 DIAGNOSIS — M542 Cervicalgia: Secondary | ICD-10-CM | POA: Diagnosis not present

## 2016-04-18 DIAGNOSIS — M5126 Other intervertebral disc displacement, lumbar region: Secondary | ICD-10-CM | POA: Diagnosis not present

## 2016-04-18 DIAGNOSIS — M5416 Radiculopathy, lumbar region: Secondary | ICD-10-CM | POA: Diagnosis not present

## 2016-04-18 DIAGNOSIS — M4726 Other spondylosis with radiculopathy, lumbar region: Secondary | ICD-10-CM | POA: Diagnosis not present

## 2016-05-18 ENCOUNTER — Telehealth: Payer: Self-pay

## 2016-05-18 DIAGNOSIS — M545 Low back pain: Secondary | ICD-10-CM

## 2016-05-18 DIAGNOSIS — Z08 Encounter for follow-up examination after completed treatment for malignant neoplasm: Secondary | ICD-10-CM

## 2016-05-18 DIAGNOSIS — G8929 Other chronic pain: Secondary | ICD-10-CM

## 2016-05-18 DIAGNOSIS — Z86000 Personal history of in-situ neoplasm of breast: Principal | ICD-10-CM

## 2016-05-18 DIAGNOSIS — N62 Hypertrophy of breast: Secondary | ICD-10-CM

## 2016-05-18 NOTE — Telephone Encounter (Signed)
Patient is requesting a referral for breast reduction to [facility] due to back pain for over 20 years. She has an appointment coming up with Graylin ShiverGregory S. Barbaraann CaoGeorgiade, MD  Plastic and Reconstructive Surgeon [phone]:[phone]

## 2016-05-18 NOTE — Telephone Encounter (Signed)
Referral placed.

## 2016-05-27 ENCOUNTER — Telehealth: Payer: Self-pay

## 2016-05-27 DIAGNOSIS — N62 Hypertrophy of breast: Secondary | ICD-10-CM | POA: Diagnosis not present

## 2016-05-27 MED ORDER — OSELTAMIVIR PHOSPHATE 75 MG PO CAPS: 75.0000 mg | ORAL_CAPSULE | Freq: Every day | ORAL | 0 refills | Status: DC

## 2016-05-27 NOTE — Telephone Encounter (Signed)
Tamiflu sent to pharmacy

## 2016-05-27 NOTE — Telephone Encounter (Signed)
Pt left v/m; pt had flu shot but pts daughter dx with the type A flu; pts daughter has fever and vomiting;pt request preventative dosing of tamiflu to [facility] [address]annual exam on 03/08/16.

## 2016-05-27 NOTE — Addendum Note (Signed)
Addended by: Lorre MunroeBAITY, Traven Davids W on: 05/27/2016 04:48 PM   Modules accepted: Orders

## 2016-06-02 ENCOUNTER — Other Ambulatory Visit: Payer: Self-pay | Admitting: Family Medicine

## 2016-06-02 DIAGNOSIS — Z1231 Encounter for screening mammogram for malignant neoplasm of breast: Secondary | ICD-10-CM

## 2016-06-03 ENCOUNTER — Ambulatory Visit
Admission: RE | Admit: 2016-06-03 | Discharge: 2016-06-03 | Disposition: A | Discharge status: Home / Self Care | Payer: 59 | Source: Ambulatory Visit | Attending: Family Medicine | Admitting: Family Medicine

## 2016-06-03 DIAGNOSIS — Z1231 Encounter for screening mammogram for malignant neoplasm of breast: Secondary | ICD-10-CM | POA: Insufficient documentation

## 2016-06-03 DIAGNOSIS — N6489 Other specified disorders of breast: Secondary | ICD-10-CM | POA: Insufficient documentation

## 2016-08-10 DIAGNOSIS — M5136 Other intervertebral disc degeneration, lumbar region: Secondary | ICD-10-CM | POA: Diagnosis not present

## 2016-08-10 DIAGNOSIS — M5416 Radiculopathy, lumbar region: Secondary | ICD-10-CM | POA: Diagnosis not present

## 2016-10-03 ENCOUNTER — Encounter: Payer: Self-pay | Admitting: Family Medicine

## 2016-10-03 ENCOUNTER — Ambulatory Visit (INDEPENDENT_AMBULATORY_CARE_PROVIDER_SITE_OTHER): Payer: 59 | Admitting: Family Medicine

## 2016-10-03 VITALS — BP 120/86 | HR 76 | Temp 98.3°F | Ht 60.0 in | Wt 169.5 lb

## 2016-10-03 DIAGNOSIS — Z01818 Encounter for other preprocedural examination: Secondary | ICD-10-CM | POA: Diagnosis not present

## 2016-10-03 LAB — CBC WITH DIFFERENTIAL/PLATELET
BASOS ABS: 0.1 10*3/uL (ref 0.0–0.1)
BASOS PCT: 0.7 % (ref 0.0–3.0)
EOS ABS: 0.2 10*3/uL (ref 0.0–0.7)
Eosinophils Relative: 2.4 % (ref 0.0–5.0)
HEMATOCRIT: 37.4 % (ref 36.0–46.0)
HEMOGLOBIN: 12.4 g/dL (ref 12.0–15.0)
LYMPHS PCT: 34.1 % (ref 12.0–46.0)
Lymphs Abs: 2.4 10*3/uL (ref 0.7–4.0)
MCHC: 33.2 g/dL (ref 30.0–36.0)
MCV: 82.8 fl (ref 78.0–100.0)
Monocytes Absolute: 0.5 10*3/uL (ref 0.1–1.0)
Monocytes Relative: 6.6 % (ref 3.0–12.0)
NEUTROS ABS: 3.9 10*3/uL (ref 1.4–7.7)
Neutrophils Relative %: 56.2 % (ref 43.0–77.0)
PLATELETS: 416 10*3/uL — AB (ref 150.0–400.0)
RBC: 4.52 Mil/uL (ref 3.87–5.11)
RDW: 13.4 % (ref 11.5–15.5)
WBC: 6.9 10*3/uL (ref 4.0–10.5)

## 2016-10-03 LAB — COMPREHENSIVE METABOLIC PANEL
ALT: 15 U/L (ref 0–35)
AST: 17 U/L (ref 0–37)
Albumin: 4.4 g/dL (ref 3.5–5.2)
Alkaline Phosphatase: 53 U/L (ref 39–117)
BUN: 11 mg/dL (ref 6–23)
CALCIUM: 9.2 mg/dL (ref 8.4–10.5)
CHLORIDE: 104 meq/L (ref 96–112)
CO2: 26 meq/L (ref 19–32)
Creatinine, Ser: 0.83 mg/dL (ref 0.40–1.20)
GFR: 76.87 mL/min (ref >60.00)
Glucose, Bld: 103 mg/dL — ABNORMAL HIGH (ref 70–99)
POTASSIUM: 4 meq/L (ref 3.5–5.1)
Sodium: 137 mEq/L (ref 135–145)
Total Bilirubin: 0.6 mg/dL (ref 0.2–1.2)
Total Protein: 7.5 g/dL (ref 6.0–8.3)

## 2016-10-03 MED ORDER — ALPRAZOLAM 0.25 MG PO TABS: ORAL_TABLET | ORAL | 0 refills | Status: DC

## 2016-10-03 NOTE — Progress Notes (Signed)
Subjective:    Kara Mayer is a 51 y.o. female who presents to the office today for a preoperative consultation at the request of surgeon Dr. Barbaraann CaoGeorgiade who plans on performing breast reduction on August 28.   Has had no issues with anesthesia or surgical recovery.   Current Outpatient Prescriptions on File Prior to Visit  Medication Sig Dispense Refill  . ALPRAZolam (XANAX) 0.25 MG tablet Take 1/2 -1 tablet by mouth every 8 hours as needed 30 tablet 0  . sertraline (ZOLOFT) 100 MG tablet Take 1 tablet (100 mg total) by mouth daily. 90 tablet 3  . tiZANidine (ZANAFLEX) 4 MG capsule Take 4 mg by mouth 3 (three) times daily as needed.     No current facility-administered medications on file prior to visit.     No Known Allergies  No past medical history on file.  No past surgical history on file.  No family history on file.  Social History   Social History  . Marital status: Married    Spouse name: N/A  . Number of children: N/A  . Years of education: N/A   Occupational History  . Not on file.   Social History Main Topics  . Smoking status: Never Smoker  . Smokeless tobacco: Never Used  . Alcohol use 0.0 oz/week     Comment: on weekends  . Drug use: No  . Sexual activity: Not on file   Other Topics Concern  . Not on file   Social History Narrative  . No narrative on file   The PMH, PSH, Social History, Family History, Medications, and [facility], and have been updated if relevant.  Review of Systems Musculoskeletal:positive for back pain    Objective:    BP 120/86   Pulse 76   Temp 98.3 F (36.8 C) (Oral)   Ht 5' (1.524 m)   Wt 169 lb 8 oz (76.9 kg)   BMI 33.10 kg/m   General Appearance:    Alert, cooperative, no distress, appears stated age  Head:    Normocephalic, without obvious abnormality, atraumatic  Eyes:    PERRL, conjunctiva/corneas clear, EOM's intact, fundi    benign, both eyes  Ears:    Normal TM's and external  ear canals, both ears  Nose:   Nares normal, septum midline, mucosa normal, no drainage    or sinus tenderness  Throat:   Lips, mucosa, and tongue normal; teeth and gums normal  Neck:   Supple, symmetrical, trachea midline, no adenopathy;    thyroid:  no enlargement/tenderness/nodules; no carotid   bruit or JVD  Back:     Symmetric, no curvature, ROM normal, no CVA tenderness  Lungs:     Clear to auscultation bilaterally, respirations unlabored  Chest Wall:    No tenderness or deformity   Heart:    Regular rate and rhythm, S1 and S2 normal, no murmur, rub   or gallop  Extremities:   Extremities normal, atraumatic, no cyanosis or edema  Pulses:   2+ and symmetric all extremities  Skin:   Skin color, texture, turgor normal, no rashes or lesions  Lymph nodes:   Cervical, supraclavicular, and axillary nodes normal  Neurologic:   CNII-XII intact, normal strength, sensation and reflexes    throughout    Cardiographics ECG: normal sinus rhythm, no blocks or conduction defects, no ischemic changes      Assessment/Plan:      51 y.o. female with planned surgery as above.  Known risk factors for perioperative complications: None   Difficulty with intubation is not anticipated.  Cardiac Risk Estimation: low

## 2016-11-01 HISTORY — PX: BREAST REDUCTION SURGERY: SHX8

## 2016-11-02 DIAGNOSIS — Z719 Counseling, unspecified: Secondary | ICD-10-CM | POA: Diagnosis not present

## 2017-02-01 DIAGNOSIS — M1711 Unilateral primary osteoarthritis, right knee: Secondary | ICD-10-CM | POA: Diagnosis not present

## 2017-03-17 ENCOUNTER — Other Ambulatory Visit: Payer: Self-pay

## 2017-03-17 MED ORDER — SERTRALINE HCL 100 MG PO TABS: 100.0000 mg | ORAL_TABLET | Freq: Every day | ORAL | 0 refills | Status: DC

## 2017-05-18 ENCOUNTER — Telehealth: Payer: Self-pay

## 2017-05-18 ENCOUNTER — Encounter: Payer: Self-pay | Admitting: Family Medicine

## 2017-05-18 ENCOUNTER — Ambulatory Visit (INDEPENDENT_AMBULATORY_CARE_PROVIDER_SITE_OTHER): Payer: 59 | Admitting: Family Medicine

## 2017-05-18 VITALS — BP 124/86 | HR 68 | Temp 98.1°F | Ht 60.25 in | Wt 160.6 lb

## 2017-05-18 DIAGNOSIS — Z0000 Encounter for general adult medical examination without abnormal findings: Secondary | ICD-10-CM | POA: Diagnosis not present

## 2017-05-18 DIAGNOSIS — F329 Major depressive disorder, single episode, unspecified: Secondary | ICD-10-CM

## 2017-05-18 DIAGNOSIS — Z01419 Encounter for gynecological examination (general) (routine) without abnormal findings: Secondary | ICD-10-CM | POA: Insufficient documentation

## 2017-05-18 DIAGNOSIS — E785 Hyperlipidemia, unspecified: Secondary | ICD-10-CM

## 2017-05-18 LAB — COMPREHENSIVE METABOLIC PANEL
ALBUMIN: 4.3 g/dL (ref 3.5–5.2)
ALK PHOS: 57 U/L (ref 39–117)
ALT: 16 U/L (ref 0–35)
AST: 13 U/L (ref 0–37)
BUN: 9 mg/dL (ref 6–23)
CO2: 29 mEq/L (ref 19–32)
CREATININE: 0.84 mg/dL (ref 0.40–1.20)
Calcium: 9.3 mg/dL (ref 8.4–10.5)
Chloride: 103 mEq/L (ref 96–112)
GFR: 75.63 mL/min (ref >60.00)
Glucose, Bld: 113 mg/dL — ABNORMAL HIGH (ref 70–99)
POTASSIUM: 4.5 meq/L (ref 3.5–5.1)
SODIUM: 139 meq/L (ref 135–145)
TOTAL PROTEIN: 6.9 g/dL (ref 6.0–8.3)
Total Bilirubin: 0.4 mg/dL (ref 0.2–1.2)

## 2017-05-18 LAB — LIPID PANEL
CHOLESTEROL: 175 mg/dL (ref 0–200)
HDL: 54.3 mg/dL (ref >39.00)
LDL Cholesterol: 99 mg/dL (ref 0–99)
NonHDL: 120.23
Total CHOL/HDL Ratio: 3
Triglycerides: 108 mg/dL (ref 0.0–149.0)
VLDL: 21.6 mg/dL (ref 0.0–40.0)

## 2017-05-18 LAB — CBC WITH DIFFERENTIAL/PLATELET
Basophils Absolute: 0 10*3/uL (ref 0.0–0.1)
Basophils Relative: 0.5 % (ref 0.0–3.0)
EOS PCT: 2.7 % (ref 0.0–5.0)
Eosinophils Absolute: 0.2 10*3/uL (ref 0.0–0.7)
HEMATOCRIT: 36.7 % (ref 36.0–46.0)
HEMOGLOBIN: 12.2 g/dL (ref 12.0–15.0)
LYMPHS PCT: 30.6 % (ref 12.0–46.0)
Lymphs Abs: 2.2 10*3/uL (ref 0.7–4.0)
MCHC: 33.3 g/dL (ref 30.0–36.0)
MCV: 80.5 fl (ref 78.0–100.0)
MONO ABS: 0.4 10*3/uL (ref 0.1–1.0)
Monocytes Relative: 5.9 % (ref 3.0–12.0)
Neutro Abs: 4.4 10*3/uL (ref 1.4–7.7)
Neutrophils Relative %: 60.3 % (ref 43.0–77.0)
Platelets: 485 10*3/uL — ABNORMAL HIGH (ref 150.0–400.0)
RBC: 4.56 Mil/uL (ref 3.87–5.11)
RDW: 13.7 % (ref 11.5–15.5)
WBC: 7.3 10*3/uL (ref 4.0–10.5)

## 2017-05-18 LAB — TSH: TSH: 1.91 u[IU]/mL (ref 0.35–4.50)

## 2017-05-18 MED ORDER — ALPRAZOLAM 0.25 MG PO TABS: ORAL_TABLET | ORAL | 5 refills | Status: DC

## 2017-05-18 MED ORDER — SERTRALINE HCL 100 MG PO TABS: 100.0000 mg | ORAL_TABLET | Freq: Every day | ORAL | 3 refills | Status: DC

## 2017-05-18 NOTE — Assessment & Plan Note (Signed)
Due for labs today. 

## 2017-05-18 NOTE — Assessment & Plan Note (Signed)
Well controlled on current rx. No changes made. 

## 2017-05-18 NOTE — Telephone Encounter (Signed)
Cologuard order faxed with demographics and ins info per pt req/thx dmf

## 2017-05-18 NOTE — Patient Instructions (Signed)
Great to see you. I will call you with your lab results from today and you can view them online.   Cologuard will sent directly to your house.  Good luck with the new EMR!

## 2017-05-18 NOTE — Progress Notes (Signed)
Subjective:   Patient ID: Kara SenterJulie P Sample, female    DOB: [birth year], 52 y.o.   MRN: [id number]  Kara Mayer is a pleasant 52 y.o. year old female who presents to clinic today with Annual Exam (Patient is here today for a CPE without PAP.  She had a Hysterectomy and does not have a Cervix.  Breast reduction on 8.28.18 which she will get record sent. She is taking Meloxicam for her knee pain as has been Dx with Osteoarthritis but this causes GI problems.  She had a couple sips of Diet Mountain Dew this morning only.  Agrees to do a Cologuard test.)  on 05/18/2017  HPI:  Health Maintenance  Topic Date Due  . HIV Screening  03/28/1980  . COLONOSCOPY  03/29/2015  . PAP SMEAR  03/20/2017  . MAMMOGRAM  06/04/2018  . TETANUS/TDAP  02/06/2019  . INFLUENZA VACCINE  Completed   Doing well.  Had a breast reduction in 10/2016.  Very pleased with the outcome- has little to no back pain or neck pain since she had it done.  Depression- doing well on Zoloft 100 mg daily. Denies any symptoms of anxiety or depression.   Current Outpatient Medications on File Prior to Visit  Medication Sig Dispense Refill  . ALPRAZolam (XANAX) 0.25 MG tablet Take 1/2 -1 tablet by mouth every 8 hours as needed 30 tablet 0  . meloxicam (MOBIC) 15 MG tablet Take 1 tablet by mouth daily.    . sertraline (ZOLOFT) 100 MG tablet Take 1 tablet (100 mg total) by mouth daily. 90 tablet 0   No current facility-administered medications on file prior to visit.     No Known Allergies  Past Medical History:  Diagnosis Date  . Anxiety   . Depression   . Osteoarthritis    Right knee & L-Spine    Past Surgical History:  Procedure Laterality Date  . ABDOMINAL HYSTERECTOMY    . BREAST REDUCTION SURGERY  11/01/2016    Family History  Problem Relation Age of Onset  . Hypertension Mother   . Arthritis Mother   . Suicidality Father   . Hypertension Brother   . Hyperlipidemia Brother   . Asthma Daughter   . Diabetes  Maternal Grandmother   . Hypertension Maternal Grandmother   . Heart disease Maternal Grandfather   . Diabetes Paternal Grandmother     Social History   Socioeconomic History  . Marital status: Married    Spouse name: Not on file  . Number of children: Not on file  . Years of education: Not on file  . Highest education level: Not on file  Social Needs  . Financial resource strain: Not on file  . Food insecurity - worry: Not on file  . Food insecurity - inability: Not on file  . Transportation needs - medical: Not on file  . Transportation needs - non-medical: Not on file  Occupational History  . Not on file  Tobacco Use  . Smoking status: Never Smoker  . Smokeless tobacco: Never Used  Substance and Sexual Activity  . Alcohol use: Yes    Alcohol/week: 0.0 oz    Comment: on weekends  . Drug use: No  . Sexual activity: Yes  Other Topics Concern  . Not on file  Social History Narrative  . Not on file   The PMH, PSH, Social History, Family History, Medications, and allergies have been [facility], and have been updated if relevant.   Review of Systems  Constitutional: Negative.   HENT: Negative.   Eyes: Negative.   Respiratory: Negative.   Cardiovascular: Negative.   Gastrointestinal: Negative.   Endocrine: Negative.   Genitourinary: Negative.   Musculoskeletal: Negative.   Skin: Negative.   Neurological: Negative.   Psychiatric/Behavioral: Negative.   All other systems reviewed and are negative.      Objective:    BP 124/86 (BP Location: Left Arm, Patient Position: Sitting, Cuff Size: Normal)   Pulse 68   Temp 98.1 F (36.7 C) (Oral)   Ht 5' 0.25" (1.53 m)   Wt 160 lb 9.6 oz (72.8 kg)   SpO2 98%   BMI 31.11 kg/m    Physical Exam   General:  Well-developed,well-nourished,in no acute distress; alert,appropriate and cooperative throughout examination Head:  normocephalic and atraumatic.   Eyes:  vision grossly intact, PERRL Ears:  R ear normal  and L ear normal externally, TMs clear bilaterally Nose:  no external deformity.   Mouth:  good dentition.   Neck:  No deformities, masses, or tenderness noted. Breasts:  Bilateral well healed scars, No mass, nodules, thickening, tenderness, bulging, retraction, inflamation, nipple discharge or skin changes noted.   Lungs:  Normal respiratory effort, chest expands symmetrically. Lungs are clear to auscultation, no crackles or wheezes. Heart:  Normal rate and regular rhythm. S1 and S2 normal without gallop, murmur, click, rub or other extra sounds. Msk:  No deformity or scoliosis noted of thoracic or lumbar spine.   Extremities:  No clubbing, cyanosis, edema, or deformity noted with normal full range of motion of all joints.   Neurologic:  alert & oriented X3 and gait normal.   Skin:  Intact without suspicious lesions or rashes Cervical Nodes:  No lymphadenopathy noted Axillary Nodes:  No palpable lymphadenopathy Psych:  Cognition and judgment appear intact. Alert and cooperative with normal attention span and concentration. No apparent delusions, illusions, hallucinations       Assessment & Plan:   Hyperlipidemia, unspecified hyperlipidemia type  Well woman exam without gynecological exam  Current episode of major depressive disorder without prior episode, unspecified depression episode severity No Follow-up on file.

## 2017-05-18 NOTE — Assessment & Plan Note (Signed)
Reviewed preventive care protocols, scheduled due services, and updated immunizations Discussed nutrition, exercise, diet, and healthy lifestyle.  Orders Placed This Encounter  Procedures  . CBC with Differential/Platelet  . Comprehensive metabolic panel  . Lipid panel  . TSH     

## 2017-07-10 ENCOUNTER — Encounter: Payer: Self-pay | Admitting: Family Medicine

## 2017-07-10 DIAGNOSIS — Z1212 Encounter for screening for malignant neoplasm of rectum: Secondary | ICD-10-CM | POA: Diagnosis not present

## 2017-07-10 DIAGNOSIS — Z1211 Encounter for screening for malignant neoplasm of colon: Secondary | ICD-10-CM | POA: Diagnosis not present

## 2017-07-10 LAB — COLOGUARD

## 2017-07-24 NOTE — Telephone Encounter (Signed)
Received Cologuard results/Called and LMOVM stating that test that was mailed in is negative/PEC may reiterate this message/thx dmf

## 2017-12-22 ENCOUNTER — Other Ambulatory Visit: Payer: Self-pay | Admitting: Family Medicine

## 2017-12-25 NOTE — Telephone Encounter (Signed)
TA-Last OV was CPE on 3.14.19/PMP ok no red flags/last filled 9.3.19/plz advise if ok to fill and get pt to sched a F/U? thx dmf

## 2018-05-18 ENCOUNTER — Other Ambulatory Visit: Payer: Self-pay | Admitting: Family Medicine

## 2018-05-18 NOTE — Telephone Encounter (Signed)
TA-Pt just seen for Wellness/last filled in October/per [place] PMP pt is compliant/plz advise/thx dmf

## 2018-05-27 ENCOUNTER — Other Ambulatory Visit: Payer: Self-pay | Admitting: Family Medicine

## 2018-11-18 NOTE — Assessment & Plan Note (Signed)
Reviewed preventive care protocols, scheduled due services, and updated immunizations Discussed nutrition, exercise, diet, and healthy lifestyle.  Pap smear done today.  Flu vaccine given.  Mammogram ordered and phone number for breast center given to pt to schedule her own mammogram.

## 2018-11-18 NOTE — Progress Notes (Signed)
Subjective:   Patient ID: Kara Mayer, female    DOB: 14-Feb-1966, 53 y.o.   MRN: [id number]  Kara Mayer is a pleasant 53 y.o. year old female who presents to clinic today with Annual Exam  on 11/20/2018  HPI:  Health Maintenance  Topic Date Due  . HIV Screening  03/28/1980  . PAP SMEAR-Modifier  03/20/2017  . MAMMOGRAM  06/04/2018  . TETANUS/TDAP  02/06/2019  . Fecal DNA (Cologuard)  07/10/2020  . INFLUENZA VACCINE  Completed   Remote h/o hysterectomy but still has cervix. colo guard neg 07/10/17. Mammogram 06/03/16  Doing well.  Had a breast reduction in 10/2016.  Very pleased with the outcome- has little to no back pain or neck pain since she had it done.  Depression- doing well on Zoloft 100 mg daily. Having more difficulty staying a sleep.  [facility] 11/20/2018 10/03/2016  Decreased Interest 1 1  Down, Depressed, Hopeless 1 1  PHQ - 2 Score 2 2  Altered sleeping 2 -  Tired, decreased energy 2 -  Change in appetite 0 -  Feeling bad or failure about yourself  0 -  Trouble concentrating 0 -  Moving slowly or fidgety/restless 0 -  Suicidal thoughts 0 -  PHQ-9 Score 6 -  Difficult doing work/chores Somewhat difficult -   GAD 7 : Generalized Anxiety Score 11/20/2018  Nervous, Anxious, on Edge 2  Control/stop worrying 2  Worry too much - different things 2  Trouble relaxing 2  Restless 2  Easily annoyed or irritable 2  Afraid - awful might happen 2  Total GAD 7 Score 14  Anxiety Difficulty Somewhat difficult    Lab Results  Component Value Date   CHOL 175 05/18/2017   HDL 54.30 05/18/2017   LDLCALC 99 05/18/2017   TRIG 108.0 05/18/2017   CHOLHDL 3 05/18/2017   Lab Results  Component Value Date   NA 139 05/18/2017   K 4.5 05/18/2017   CL 103 05/18/2017   CO2 29 05/18/2017   Lab Results  Component Value Date   ALT 16 05/18/2017   AST 13 05/18/2017   ALKPHOS 57 05/18/2017   BILITOT 0.4 05/18/2017   Lab Results  Component Value Date    CREATININE 0.84 05/18/2017   BP Readings from Last 3 Encounters:  11/20/18 (!) 162/92  05/18/17 124/86  10/03/16 120/86      Current Outpatient Medications on File Prior to Visit  Medication Sig Dispense Refill  . ALPRAZolam (XANAX) 0.25 MG tablet TAKE 1/2 TO 1 TABLET BY MOUTH EVERY 8 HOURS AS NEEDED 30 tablet 5  . sertraline (ZOLOFT) 100 MG tablet TAKE 1 TABLET(100 MG) BY MOUTH DAILY 90 tablet 1   No current facility-administered medications on file prior to visit.     No Known Allergies  Past Medical History:  Diagnosis Date  . Anxiety   . Depression   . Osteoarthritis    Right knee & L-Spine    Past Surgical History:  Procedure Laterality Date  . ABDOMINAL HYSTERECTOMY    . BREAST REDUCTION SURGERY  11/01/2016    Family History  Problem Relation Age of Onset  . Hypertension Mother   . Arthritis Mother   . Suicidality Father   . Hypertension Brother   . Hyperlipidemia Brother   . Asthma Daughter   . Diabetes Maternal Grandmother   . Hypertension Maternal Grandmother   . Heart disease Maternal Grandfather   . Diabetes Paternal Grandmother  Social History   Socioeconomic History  . Marital status: Married    Spouse name: Not on file  . Number of children: Not on file  . Years of education: Not on file  . Highest education level: Not on file  Occupational History  . Not on file  Social Needs  . Financial resource strain: Not on file  . Food insecurity    Worry: Not on file    Inability: Not on file  . Transportation needs    Medical: Not on file    Non-medical: Not on file  Tobacco Use  . Smoking status: Never Smoker  . Smokeless tobacco: Never Used  Substance and Sexual Activity  . Alcohol use: Yes    Alcohol/week: 0.0 standard drinks    Comment: on weekends  . Drug use: No  . Sexual activity: Yes  Lifestyle  . Physical activity    Days per week: Not on file    Minutes per session: Not on file  . Stress: Not on file  Relationships   . Social Herbalist on phone: Not on file    Gets together: Not on file    Attends religious service: Not on file    Active member of club or organization: Not on file    Attends meetings of clubs or organizations: Not on file    Relationship status: Not on file  . Intimate partner violence    Fear of current or ex partner: Not on file    Emotionally abused: Not on file    Physically abused: Not on file    Forced sexual activity: Not on file  Other Topics Concern  . Not on file  Social History Narrative  . Not on file   The PMH, PSH, Social History, Family History, Medications, and allergies have been reviewed in [facility], and have been updated if relevant.   Review of Systems  Constitutional: Negative.   HENT: Negative.   Eyes: Negative.   Respiratory: Negative.   Cardiovascular: Negative.   Gastrointestinal: Negative.   Endocrine: Negative.   Genitourinary: Negative.   Musculoskeletal: Negative.   Skin: Negative.   Neurological: Negative.   Psychiatric/Behavioral: Negative.   All other systems reviewed and are negative.      Objective:    BP (!) 162/92   Pulse 79   Temp 97.9 F (36.6 C)   Ht 5\' 1"  (1.549 m)   Wt 160 lb (72.6 kg)   SpO2 98%   BMI 30.23 kg/m    Physical Exam   General:  Well-developed,well-nourished,in no acute distress; alert,appropriate and cooperative throughout examination Head:  normocephalic and atraumatic.   Eyes:  vision grossly intact, PERRL Ears:  R ear normal and L ear normal externally, TMs clear bilaterally Nose:  no external deformity.   Mouth:  good dentition.   Neck:  No deformities, masses, or tenderness noted. Breasts:  Bilateral well healed scars, No mass, nodules, thickening, tenderness, bulging, retraction, inflamation, nipple discharge or skin changes noted.   Lungs:  Normal respiratory effort, chest expands symmetrically. Lungs are clear to auscultation, no crackles or wheezes. Heart:  Normal rate and regular  rhythm. S1 and S2 normal without gallop, murmur, click, rub or other extra sounds. Abdomen:  Bowel sounds positive,abdomen soft and non-tender without masses, organomegaly or hernias noted. Rectal:  no external abnormalities.   Genitalia:  Pelvic Exam:        External: normal female genitalia without lesions or masses  Vagina: normal without lesions or masses        Cervix: normal without lesions or masses        Adnexa: normal bimanual exam without masses or fullness        Uterus: normal by palpation        Pap smear: performed Msk:  No deformity or scoliosis noted of thoracic or lumbar spine.   Extremities:  No clubbing, cyanosis, edema, or deformity noted with normal full range of motion of all joints.   Neurologic:  alert & oriented X3 and gait normal.   Skin:  Intact without suspicious lesions or rashes Cervical Nodes:  No lymphadenopathy noted Axillary Nodes:  No palpable lymphadenopathy Psych:  Cognition and judgment appear intact. Alert and cooperative with normal attention span and concentration. No apparent delusions, illusions, hallucinations        Assessment & Plan:   Screening for breast cancer - Plan: MM 3D SCREEN BREAST BILATERAL  Current episode of major depressive disorder without prior episode, unspecified depression episode severity  Hyperlipidemia, unspecified hyperlipidemia type - Plan: CBC with Differential/Platelet, Comprehensive metabolic panel, Lipid panel, TSH  Screening for HIV (human immunodeficiency virus) - Plan: HIV antibody (with reflex)  Well woman exam with routine gynecological exam - Plan: Cytology - PAP( [facility] [place])  Anxiety state  Adjustment insomnia No follow-ups on file.

## 2018-11-18 NOTE — Patient Instructions (Addendum)
Great to see you. I will call you with your lab results from today and you can view them online.   Please call the [facility] at [phone] to schedule your mammogram.   We are starting Trazodone 25- 50 mg nightly.  Please update me in a few weeks.

## 2018-11-19 ENCOUNTER — Other Ambulatory Visit: Payer: Self-pay

## 2018-11-20 ENCOUNTER — Encounter: Payer: Self-pay | Admitting: Family Medicine

## 2018-11-20 ENCOUNTER — Ambulatory Visit (INDEPENDENT_AMBULATORY_CARE_PROVIDER_SITE_OTHER): Payer: BC Managed Care – PPO | Admitting: Family Medicine

## 2018-11-20 ENCOUNTER — Other Ambulatory Visit (HOSPITAL_COMMUNITY)
Admission: RE | Admit: 2018-11-20 | Discharge: 2018-11-20 | Disposition: A | Discharge status: Home / Self Care | Payer: BC Managed Care – PPO | Source: Ambulatory Visit | Attending: Family Medicine | Admitting: Family Medicine

## 2018-11-20 VITALS — BP 138/82 | HR 79 | Temp 97.9°F | Ht 61.0 in | Wt 160.0 lb

## 2018-11-20 DIAGNOSIS — F5102 Adjustment insomnia: Secondary | ICD-10-CM | POA: Diagnosis not present

## 2018-11-20 DIAGNOSIS — Z01419 Encounter for gynecological examination (general) (routine) without abnormal findings: Secondary | ICD-10-CM

## 2018-11-20 DIAGNOSIS — F411 Generalized anxiety disorder: Secondary | ICD-10-CM

## 2018-11-20 DIAGNOSIS — F329 Major depressive disorder, single episode, unspecified: Secondary | ICD-10-CM | POA: Diagnosis not present

## 2018-11-20 DIAGNOSIS — Z114 Encounter for screening for human immunodeficiency virus [HIV]: Secondary | ICD-10-CM

## 2018-11-20 DIAGNOSIS — E785 Hyperlipidemia, unspecified: Secondary | ICD-10-CM | POA: Diagnosis not present

## 2018-11-20 DIAGNOSIS — Z1239 Encounter for other screening for malignant neoplasm of breast: Secondary | ICD-10-CM

## 2018-11-20 DIAGNOSIS — Z0001 Encounter for general adult medical examination with abnormal findings: Secondary | ICD-10-CM | POA: Diagnosis not present

## 2018-11-20 LAB — COMPREHENSIVE METABOLIC PANEL
ALT: 15 U/L (ref 0–35)
AST: 15 U/L (ref 0–37)
Albumin: 4.5 g/dL (ref 3.5–5.2)
Alkaline Phosphatase: 64 U/L (ref 39–117)
BUN: 13 mg/dL (ref 6–23)
CO2: 29 mEq/L (ref 19–32)
Calcium: 9.8 mg/dL (ref 8.4–10.5)
Chloride: 103 mEq/L (ref 96–112)
Creatinine, Ser: 0.94 mg/dL (ref 0.40–1.20)
GFR: 62.14 mL/min (ref >60.00)
Glucose, Bld: 100 mg/dL — ABNORMAL HIGH (ref 70–99)
Potassium: 4 mEq/L (ref 3.5–5.1)
Sodium: 139 mEq/L (ref 135–145)
Total Bilirubin: 0.5 mg/dL (ref 0.2–1.2)
Total Protein: 7.6 g/dL (ref 6.0–8.3)

## 2018-11-20 LAB — CBC WITH DIFFERENTIAL/PLATELET
Basophils Absolute: 0 10*3/uL (ref 0.0–0.1)
Basophils Relative: 0.7 % (ref 0.0–3.0)
Eosinophils Absolute: 0.2 10*3/uL (ref 0.0–0.7)
Eosinophils Relative: 2.3 % (ref 0.0–5.0)
HCT: 38.8 % (ref 36.0–46.0)
Hemoglobin: 12.7 g/dL (ref 12.0–15.0)
Lymphocytes Relative: 34.8 % (ref 12.0–46.0)
Lymphs Abs: 2.3 10*3/uL (ref 0.7–4.0)
MCHC: 32.7 g/dL (ref 30.0–36.0)
MCV: 81.2 fl (ref 78.0–100.0)
Monocytes Absolute: 0.3 10*3/uL (ref 0.1–1.0)
Monocytes Relative: 5.2 % (ref 3.0–12.0)
Neutro Abs: 3.7 10*3/uL (ref 1.4–7.7)
Neutrophils Relative %: 57 % (ref 43.0–77.0)
Platelets: 413 10*3/uL — ABNORMAL HIGH (ref 150.0–400.0)
RBC: 4.78 Mil/uL (ref 3.87–5.11)
RDW: 13.8 % (ref 11.5–15.5)
WBC: 6.6 10*3/uL (ref 4.0–10.5)

## 2018-11-20 LAB — LIPID PANEL
Cholesterol: 205 mg/dL — ABNORMAL HIGH (ref 0–200)
HDL: 60 mg/dL (ref >39.00)
LDL Cholesterol: 128 mg/dL — ABNORMAL HIGH (ref 0–99)
NonHDL: 144.54
Total CHOL/HDL Ratio: 3
Triglycerides: 82 mg/dL (ref 0.0–149.0)
VLDL: 16.4 mg/dL (ref 0.0–40.0)

## 2018-11-20 LAB — TSH: TSH: 3.3 u[IU]/mL (ref 0.35–4.50)

## 2018-11-20 MED ORDER — SERTRALINE HCL 100 MG PO TABS: ORAL_TABLET | ORAL | 1 refills | Status: DC

## 2018-11-20 MED ORDER — ALPRAZOLAM 0.25 MG PO TABS: ORAL_TABLET | ORAL | 2 refills | Status: DC

## 2018-11-20 NOTE — Assessment & Plan Note (Signed)
>>  ASSESSMENT AND PLAN FOR ANXIETY STATE WRITTEN ON 11/20/2018  9:14 AM BY Dianne Dun, MD  Deteriorated due to currently stressors. She feels insomnia is worsening this.   See below.

## 2018-11-20 NOTE — Assessment & Plan Note (Signed)
Total time spent with patient was 25 minutes, with 25 minutes spent specifically on problem visit concerning Insomnia. Greater than 50 percent of the 25 minutes was spent in counseling the Insomnia. >25 min spent with patient, at least half of which was spent on counseling insomnia.  The problem of recurrent insomnia is discussed. Avoidance of caffeine sources is strongly encouraged. Sleep hygiene issues are reviewed. She agrees to a trial of trazodone 25- 50 mg nightly and will update me in a few weeks.Marland Kitchen

## 2018-11-20 NOTE — Assessment & Plan Note (Signed)
Relatively well controlled given additional stressors with COVID. She would like to continue current dose of zoloft.

## 2018-11-20 NOTE — Assessment & Plan Note (Signed)
Deteriorated due to currently stressors. She feels insomnia is worsening this.   See below.

## 2018-11-20 NOTE — Assessment & Plan Note (Signed)
Labs today.  Orders Placed This Encounter  Procedures  . MM 3D SCREEN BREAST BILATERAL  . HIV antibody (with reflex)  . CBC with Differential/Platelet  . Comprehensive metabolic panel  . Lipid panel  . TSH

## 2018-11-21 LAB — HIV ANTIBODY (ROUTINE TESTING W REFLEX): HIV 1&2 Ab, 4th Generation: NONREACTIVE

## 2018-11-21 LAB — CYTOLOGY - PAP
Adequacy: ABSENT
Diagnosis: NEGATIVE
HPV: NOT DETECTED

## 2018-11-23 ENCOUNTER — Telehealth: Payer: Self-pay | Admitting: Family Medicine

## 2018-11-23 MED ORDER — TRAZODONE HCL 50 MG PO TABS: 25.0000 mg | ORAL_TABLET | Freq: Every evening | ORAL | 3 refills | Status: DC | PRN

## 2018-11-23 NOTE — Telephone Encounter (Signed)
Patient is calling regarding Trazodone 25- 50 mg nightly  The medication has not been called in. Preferred Pharmacy- [facility] [place]. [phone]

## 2018-11-23 NOTE — Telephone Encounter (Signed)
Rx sent in

## 2018-11-23 NOTE — Telephone Encounter (Signed)
Yes okay to send in.  Thank you. 

## 2018-11-26 ENCOUNTER — Other Ambulatory Visit: Payer: Self-pay | Admitting: Family Medicine

## 2019-01-16 ENCOUNTER — Ambulatory Visit
Admission: RE | Admit: 2019-01-16 | Discharge: 2019-01-16 | Disposition: A | Discharge status: Home / Self Care | Payer: BC Managed Care – PPO | Source: Ambulatory Visit | Attending: Family Medicine | Admitting: Family Medicine

## 2019-01-16 DIAGNOSIS — Z1231 Encounter for screening mammogram for malignant neoplasm of breast: Secondary | ICD-10-CM | POA: Insufficient documentation

## 2019-01-16 DIAGNOSIS — Z1239 Encounter for other screening for malignant neoplasm of breast: Secondary | ICD-10-CM

## 2019-03-26 ENCOUNTER — Other Ambulatory Visit: Payer: Self-pay

## 2019-03-26 MED ORDER — TRAZODONE HCL 50 MG PO TABS: 25.0000 mg | ORAL_TABLET | Freq: Every evening | ORAL | 3 refills | Status: DC | PRN

## 2019-05-20 ENCOUNTER — Other Ambulatory Visit: Payer: Self-pay

## 2019-05-20 MED ORDER — SERTRALINE HCL 100 MG PO TABS: ORAL_TABLET | ORAL | 0 refills | Status: DC

## 2019-08-24 ENCOUNTER — Other Ambulatory Visit: Payer: Self-pay | Admitting: Nurse Practitioner

## 2019-09-20 DIAGNOSIS — F331 Major depressive disorder, recurrent, moderate: Secondary | ICD-10-CM | POA: Diagnosis not present

## 2019-09-20 DIAGNOSIS — F411 Generalized anxiety disorder: Secondary | ICD-10-CM | POA: Diagnosis not present

## 2019-09-20 DIAGNOSIS — F330 Major depressive disorder, recurrent, mild: Secondary | ICD-10-CM | POA: Diagnosis not present

## 2019-10-08 DIAGNOSIS — D519 Vitamin B12 deficiency anemia, unspecified: Secondary | ICD-10-CM | POA: Diagnosis not present

## 2019-10-08 DIAGNOSIS — F331 Major depressive disorder, recurrent, moderate: Secondary | ICD-10-CM | POA: Diagnosis not present

## 2019-10-08 DIAGNOSIS — M542 Cervicalgia: Secondary | ICD-10-CM | POA: Diagnosis not present

## 2019-10-08 DIAGNOSIS — F411 Generalized anxiety disorder: Secondary | ICD-10-CM | POA: Diagnosis not present

## 2019-10-08 DIAGNOSIS — M5441 Lumbago with sciatica, right side: Secondary | ICD-10-CM | POA: Diagnosis not present

## 2019-10-08 DIAGNOSIS — R5383 Other fatigue: Secondary | ICD-10-CM | POA: Diagnosis not present

## 2019-10-08 DIAGNOSIS — E559 Vitamin D deficiency, unspecified: Secondary | ICD-10-CM | POA: Diagnosis not present

## 2019-10-08 DIAGNOSIS — Z131 Encounter for screening for diabetes mellitus: Secondary | ICD-10-CM | POA: Diagnosis not present

## 2019-10-08 DIAGNOSIS — Z13220 Encounter for screening for lipoid disorders: Secondary | ICD-10-CM | POA: Diagnosis not present

## 2019-10-17 DIAGNOSIS — F411 Generalized anxiety disorder: Secondary | ICD-10-CM | POA: Diagnosis not present

## 2019-10-17 DIAGNOSIS — F330 Major depressive disorder, recurrent, mild: Secondary | ICD-10-CM | POA: Diagnosis not present

## 2019-10-30 DIAGNOSIS — G8929 Other chronic pain: Secondary | ICD-10-CM | POA: Diagnosis not present

## 2019-10-30 DIAGNOSIS — M545 Low back pain: Secondary | ICD-10-CM | POA: Diagnosis not present

## 2019-10-30 DIAGNOSIS — Z6829 Body mass index (BMI) 29.0-29.9, adult: Secondary | ICD-10-CM | POA: Diagnosis not present

## 2019-10-30 DIAGNOSIS — R030 Elevated blood-pressure reading, without diagnosis of hypertension: Secondary | ICD-10-CM | POA: Diagnosis not present

## 2019-11-13 DIAGNOSIS — Z20822 Contact with and (suspected) exposure to covid-19: Secondary | ICD-10-CM | POA: Diagnosis not present

## 2019-11-15 DIAGNOSIS — F330 Major depressive disorder, recurrent, mild: Secondary | ICD-10-CM | POA: Diagnosis not present

## 2019-11-15 DIAGNOSIS — F411 Generalized anxiety disorder: Secondary | ICD-10-CM | POA: Diagnosis not present

## 2020-01-10 DIAGNOSIS — F330 Major depressive disorder, recurrent, mild: Secondary | ICD-10-CM | POA: Diagnosis not present

## 2020-01-10 DIAGNOSIS — F411 Generalized anxiety disorder: Secondary | ICD-10-CM | POA: Diagnosis not present

## 2020-02-05 ENCOUNTER — Other Ambulatory Visit: Payer: Self-pay

## 2020-02-05 ENCOUNTER — Ambulatory Visit: Payer: BC Managed Care – PPO | Admitting: Podiatry

## 2020-02-05 ENCOUNTER — Other Ambulatory Visit: Payer: Self-pay | Admitting: Podiatry

## 2020-02-05 ENCOUNTER — Ambulatory Visit (INDEPENDENT_AMBULATORY_CARE_PROVIDER_SITE_OTHER): Payer: BC Managed Care – PPO

## 2020-02-05 ENCOUNTER — Encounter: Payer: Self-pay | Admitting: Podiatry

## 2020-02-05 DIAGNOSIS — M1711 Unilateral primary osteoarthritis, right knee: Secondary | ICD-10-CM

## 2020-02-05 DIAGNOSIS — M2012 Hallux valgus (acquired), left foot: Secondary | ICD-10-CM

## 2020-02-05 DIAGNOSIS — S92415A Nondisplaced fracture of proximal phalanx of left great toe, initial encounter for closed fracture: Secondary | ICD-10-CM

## 2020-02-05 DIAGNOSIS — M5431 Sciatica, right side: Secondary | ICD-10-CM

## 2020-02-05 DIAGNOSIS — S92405A Nondisplaced unspecified fracture of left great toe, initial encounter for closed fracture: Secondary | ICD-10-CM

## 2020-02-05 NOTE — Progress Notes (Signed)
Subjective:  Patient ID: Kara Mayer, female    DOB: 1965/07/30,  MRN: [id number] HPI Chief Complaint  Patient presents with   Foot Pain    1st MPJ left - aching, stiff, red and swollen x 1 month, has fallen twice in October and hasn't really hurt until then, tried ice   New Patient (Initial Visit)    Est pt 3yr+    54 y.o. female presents with the above complaint.   ROS: Denies fever chills nausea vomiting muscle aches pains calf pain back pain chest pain shortness of breath.  She does relate more back pain and left buttocks pain recently.  She also relates right knee pain.  Past Medical History:  Diagnosis Date   Anxiety    Depression    Osteoarthritis    Right knee & L-Spine   Past Surgical History:  Procedure Laterality Date   ABDOMINAL HYSTERECTOMY     BREAST REDUCTION SURGERY  11/01/2016   REDUCTION MAMMAPLASTY Bilateral 2018    Current Outpatient Medications:    ALPRAZolam (XANAX) 0.25 MG tablet, TAKE 1/2 TO 1 TABLET BY MOUTH EVERY 8 HOURS AS NEEDED, Disp: 30 tablet, Rfl: 2   sertraline (ZOLOFT) 100 MG tablet, Take 1qd(Plz sched with new provider), Disp: 90 tablet, Rfl: 0   traZODone (DESYREL) 50 MG tablet, Take 0.5-1 tablets (25-50 mg total) by mouth at bedtime as needed for sleep., Disp: 90 tablet, Rfl: 3   venlafaxine XR (EFFEXOR-XR) 75 MG 24 hr capsule, Take 75 mg by mouth every morning., Disp: , Rfl:   No Known Allergies Review of Systems Objective:  There were no vitals filed for this visit.  General: Well developed, nourished, in no acute distress, alert and oriented x3   Dermatological: Skin is warm, dry and supple bilateral. Nails x 10 are well maintained; remaining integument appears unremarkable at this time. There are no open sores, no preulcerative lesions, no rash or signs of infection present.  Vascular: Dorsalis Pedis artery and Posterior Tibial artery pedal pulses are 2/4 bilateral with immedate capillary fill time. Pedal hair  growth present. No varicosities and no lower extremity edema present bilateral.   Neruologic: Grossly intact via light touch bilateral. Vibratory intact via tuning fork bilateral. Protective threshold with Semmes Wienstein monofilament intact to all pedal sites bilateral. Patellar and Achilles deep tendon reflexes 2+ bilateral. No Babinski or clonus noted bilateral.  She has pain radiating down her leg with straight leg test right side.  Musculature is a little bit weaker with gait.  Musculoskeletal: No gross boney pedal deformities bilateral. No pain, crepitus, or limitation noted with foot and ankle range of motion bilateral. Muscular strength 5/5 in all groups tested bilateral.  Obvious knee pain right.  She has crepitation on range of motion of the knee.  The knee does not appear to be unstable.  Left hallux does demonstrate limited range of motion of the first metatarsophalangeal joint with some swelling at the base of the proximal phalanx.  Gait: Unassisted, Nonantalgic.    Radiographs:  Radiographs taken today demonstrate dorsal spurring of the first metatarsal head.  Joint space narrowing subchondral sclerosis and eburnation with what appears to be a nondisplaced fracture of the base of the proximal phalanx medially.  Assessment & Plan:   Assessment: History of degenerative disc disease with sciatica right.  Due to degenerative joint disease right knee.  All of this is resulting in falls which cause pain to the first metatarsophalangeal joint resulting in a fracture of the proximal  phalanx and hallux limitus.  Plan: Referral to [place] neuro[facility] Dr. Marikay Alar for degenerative disc disease and [name] for knee pain right.  And I placed her in a [place]co shoe left.  I will follow-up with her should this fail to alleviate her symptoms.     Yvonnie Schinke T. [place], [place]

## 2020-02-11 DIAGNOSIS — Z20822 Contact with and (suspected) exposure to covid-19: Secondary | ICD-10-CM | POA: Diagnosis not present

## 2020-02-11 NOTE — Addendum Note (Signed)
Addended by: Kristian Covey on: 02/11/2020 05:33 PM   Modules accepted: Orders

## 2020-10-02 LAB — COLOGUARD: COLOGUARD: NEGATIVE

## 2021-04-09 ENCOUNTER — Other Ambulatory Visit: Payer: Self-pay | Admitting: Family

## 2021-04-09 DIAGNOSIS — Z1231 Encounter for screening mammogram for malignant neoplasm of breast: Secondary | ICD-10-CM

## 2021-06-22 ENCOUNTER — Ambulatory Visit
Admission: RE | Admit: 2021-06-22 | Discharge: 2021-06-22 | Disposition: A | Discharge status: Home / Self Care | Payer: BC Managed Care – PPO | Source: Ambulatory Visit | Attending: Family | Admitting: Family

## 2021-06-22 DIAGNOSIS — Z1231 Encounter for screening mammogram for malignant neoplasm of breast: Secondary | ICD-10-CM | POA: Diagnosis present

## 2022-06-16 ENCOUNTER — Other Ambulatory Visit: Payer: 59

## 2022-06-16 DIAGNOSIS — E559 Vitamin D deficiency, unspecified: Secondary | ICD-10-CM

## 2022-06-16 DIAGNOSIS — R7303 Prediabetes: Secondary | ICD-10-CM

## 2022-06-16 DIAGNOSIS — E782 Mixed hyperlipidemia: Secondary | ICD-10-CM

## 2022-06-16 DIAGNOSIS — R5383 Other fatigue: Secondary | ICD-10-CM

## 2022-06-17 LAB — CBC WITH DIFFERENTIAL
Basophils Absolute: 0 10*3/uL (ref 0.0–0.2)
Basos: 1 %
EOS (ABSOLUTE): 0.1 10*3/uL (ref 0.0–0.4)
Eos: 1 %
Hematocrit: 39 % (ref 34.0–46.6)
Hemoglobin: 12.5 g/dL (ref 11.1–15.9)
Immature Grans (Abs): 0 10*3/uL (ref 0.0–0.1)
Immature Granulocytes: 0 %
Lymphocytes Absolute: 1.4 10*3/uL (ref 0.7–3.1)
Lymphs: 25 %
MCH: 26.5 pg — ABNORMAL LOW (ref 26.6–33.0)
MCHC: 32.1 g/dL (ref 31.5–35.7)
MCV: 83 fL (ref 79–97)
Monocytes Absolute: 0.4 10*3/uL (ref 0.1–0.9)
Monocytes: 7 %
Neutrophils Absolute: 3.7 10*3/uL (ref 1.4–7.0)
Neutrophils: 66 %
RBC: 4.71 x10E6/uL (ref 3.77–5.28)
RDW: 12.5 % (ref 11.7–15.4)
WBC: 5.6 10*3/uL (ref 3.4–10.8)

## 2022-06-17 LAB — VITAMIN D 25 HYDROXY (VIT D DEFICIENCY, FRACTURES): Vit D, 25-Hydroxy: 29.8 ng/mL — ABNORMAL LOW (ref 30.0–100.0)

## 2022-06-17 LAB — CMP14+EGFR
ALT: 12 IU/L (ref 0–32)
AST: 16 IU/L (ref 0–40)
Albumin/Globulin Ratio: 1.9 (ref 1.2–2.2)
Albumin: 4.5 g/dL (ref 3.8–4.9)
Alkaline Phosphatase: 63 IU/L (ref 44–121)
BUN/Creatinine Ratio: 9 (ref 9–23)
BUN: 8 mg/dL (ref 6–24)
Bilirubin Total: 0.3 mg/dL (ref 0.0–1.2)
CO2: 24 mmol/L (ref 20–29)
Calcium: 9.6 mg/dL (ref 8.7–10.2)
Chloride: 102 mmol/L (ref 96–106)
Creatinine, Ser: 0.85 mg/dL (ref 0.57–1.00)
Globulin, Total: 2.4 g/dL (ref 1.5–4.5)
Glucose: 91 mg/dL (ref 70–99)
Potassium: 4.5 mmol/L (ref 3.5–5.2)
Sodium: 140 mmol/L (ref 134–144)
Total Protein: 6.9 g/dL (ref 6.0–8.5)
eGFR: 80 mL/min/{1.73_m2} (ref >59)

## 2022-06-17 LAB — VITAMIN B12: Vitamin B-12: 558 pg/mL (ref 232–1245)

## 2022-06-17 LAB — LIPID PANEL
Chol/HDL Ratio: 2.9 ratio (ref 0.0–4.4)
Cholesterol, Total: 172 mg/dL (ref 100–199)
HDL: 60 mg/dL (ref >39)
LDL Chol Calc (NIH): 99 mg/dL (ref 0–99)
Triglycerides: 71 mg/dL (ref 0–149)
VLDL Cholesterol Cal: 13 mg/dL (ref 5–40)

## 2022-06-17 LAB — HEMOGLOBIN A1C
Est. average glucose Bld gHb Est-mCnc: 117 mg/dL
Hgb A1c MFr Bld: 5.7 % — ABNORMAL HIGH (ref 4.8–5.6)

## 2022-06-21 ENCOUNTER — Ambulatory Visit (INDEPENDENT_AMBULATORY_CARE_PROVIDER_SITE_OTHER): Payer: 59 | Admitting: Family

## 2022-06-21 ENCOUNTER — Encounter: Payer: Self-pay | Admitting: Family

## 2022-06-21 VITALS — BP 128/70 | HR 81 | Ht 61.0 in | Wt 122.4 lb

## 2022-06-21 DIAGNOSIS — F411 Generalized anxiety disorder: Secondary | ICD-10-CM

## 2022-06-21 DIAGNOSIS — F410 Panic disorder [episodic paroxysmal anxiety] without agoraphobia: Secondary | ICD-10-CM | POA: Diagnosis not present

## 2022-06-21 MED ORDER — BUSPIRONE HCL 10 MG PO TABS: 10.0000 mg | ORAL_TABLET | Freq: Two times a day (BID) | ORAL | 1 refills | Status: DC

## 2022-06-22 ENCOUNTER — Encounter: Payer: Self-pay | Admitting: Family

## 2022-06-22 DIAGNOSIS — M545 Low back pain, unspecified: Secondary | ICD-10-CM | POA: Insufficient documentation

## 2022-06-22 NOTE — Progress Notes (Signed)
Established Patient Office Visit  Subjective:  Patient ID: Kara Mayer, female    DOB: November 08, 1965  Age: 57 y.o. MRN: [id number]  Chief Complaint  Patient presents with   Anxiety   Depression    Patient here today with issues with her anxiety.   Anxiety Presents for follow-up visit. Symptoms include excessive worry, insomnia, nervous/anxious behavior and restlessness. The severity of symptoms is severe and interfering with daily activities. The quality of sleep is poor. Nighttime awakenings: several.   Compliance with medications is 51-75%.  Depression        This is a recurrent problem.  The current episode started more than 1 year ago.   Associated symptoms include insomnia and restlessness.     The symptoms are aggravated by work stress and family issues.  Past treatments include SNRIs - Serotonin and norepinephrine reuptake inhibitors, SSRIs - Selective serotonin reuptake inhibitors and other medications.  Compliance with treatment is variable and good.  Previous treatment provided mild relief.  Risk factors include a recent illness.   Past medical history includes anxiety.    She also had labs done last week, so we will review these today.  No other concerns at this time.   Past Medical History:  Diagnosis Date   Anxiety    Depression    Osteoarthritis    Right knee & L-Spine    Past Surgical History:  Procedure Laterality Date   ABDOMINAL HYSTERECTOMY     BREAST REDUCTION SURGERY  11/01/2016   REDUCTION MAMMAPLASTY Bilateral 2018    Social History   Socioeconomic History   Marital status: Married    Spouse name: Not on file   Number of children: Not on file   Years of education: Not on file   Highest education level: Not on file  Occupational History   Not on file  Tobacco Use   Smoking status: Never   Smokeless tobacco: Never  Vaping Use   Vaping Use: Never used  Substance and Sexual Activity   Alcohol use: Yes    Alcohol/week: 0.0 standard drinks of  alcohol    Comment: on weekends   Drug use: No   Sexual activity: Yes  Other Topics Concern   Not on file  Social History Narrative   Not on file   Social Determinants of Health   Financial Resource Strain: Not on file  Food Insecurity: Not on file  Transportation Needs: Not on file  Physical Activity: Not on file  Stress: Not on file  Social Connections: Not on file  Intimate Partner Violence: Not on file    Family History  Problem Relation Age of Onset   Hypertension Mother    Arthritis Mother    Suicidality Father    Hypertension Brother    Hyperlipidemia Brother    Asthma Daughter    Diabetes Maternal Grandmother    Hypertension Maternal Grandmother    Heart disease Maternal Grandfather    Diabetes Paternal Grandmother    Breast cancer Neg Hx     No Known Allergies  Review of Systems  Psychiatric/Behavioral:  Positive for depression. The patient is nervous/anxious and has insomnia.   All other systems reviewed and are negative.      Objective:   BP 128/70   Pulse 81   Ht  (1.549 m)   Wt 122 lb 6.4 oz (55.5 kg)   SpO2 99%   BMI 23.13 kg/m   Vitals:   06/21/22 0929  BP: 128/70  Pulse:  81  Height: 5\' 1"  (1.549 m)  Weight: 122 lb 6.4 oz (55.5 kg)  SpO2: 99%  BMI (Calculated): 23.14    Physical Exam Vitals and nursing note reviewed.  Constitutional:      Appearance: Normal appearance. She is normal weight.  HENT:     Head: Normocephalic.  Eyes:     Pupils: Pupils are equal, round, and reactive to light.  Cardiovascular:     Rate and Rhythm: Normal rate.  Pulmonary:     Effort: Pulmonary effort is normal.  Neurological:     Mental Status: She is alert.  Psychiatric:        Attention and Perception: She is inattentive.        Mood and Affect: Mood is anxious and depressed. Affect is tearful.        Speech: Speech normal.        Behavior: Behavior normal. Behavior is cooperative.        Thought Content: Thought content normal.         Cognition and Memory: Cognition and memory normal.        Judgment: Judgment normal.     No results found for any visits on 06/21/22.  Recent Results (from the past 2160 hour(s))  CBC With Differential     Status: Abnormal   Collection Time: 06/16/22  8:37 AM  Result Value Ref Range   WBC 5.6 3.4 - 10.8 x10E3/uL   RBC 4.71 3.77 - 5.28 x10E6/uL   Hemoglobin 12.5 11.1 - 15.9 g/dL   Hematocrit 16.1 09.6 - 46.6 %   MCV 83 79 - 97 fL   MCH 26.5 (L) 26.6 - 33.0 pg   MCHC 32.1 31.5 - 35.7 g/dL   RDW 04.5 40.9 - 81.1 %   Neutrophils 66 Not Estab. %   Lymphs 25 Not Estab. %   Monocytes 7 Not Estab. %   Eos 1 Not Estab. %   Basos 1 Not Estab. %   Neutrophils Absolute 3.7 1.4 - 7.0 x10E3/uL   Lymphocytes Absolute 1.4 0.7 - 3.1 x10E3/uL   Monocytes Absolute 0.4 0.1 - 0.9 x10E3/uL   EOS (ABSOLUTE) 0.1 0.0 - 0.4 x10E3/uL   Basophils Absolute 0.0 0.0 - 0.2 x10E3/uL   Immature Granulocytes 0 Not Estab. %   Immature Grans (Abs) 0.0 0.0 - 0.1 x10E3/uL  CMP14+EGFR     Status: None   Collection Time: 06/16/22  8:37 AM  Result Value Ref Range   Glucose 91 70 - 99 mg/dL   BUN 8 6 - 24 mg/dL   Creatinine, Ser 9.14 0.57 - 1.00 mg/dL   eGFR 80 >78 GN/FAO/1.30   BUN/Creatinine Ratio 9 9 - 23   Sodium 140 134 - 144 mmol/L   Potassium 4.5 3.5 - 5.2 mmol/L   Chloride 102 96 - 106 mmol/L   CO2 24 20 - 29 mmol/L   Calcium 9.6 8.7 - 10.2 mg/dL   Total Protein 6.9 6.0 - 8.5 g/dL   Albumin 4.5 3.8 - 4.9 g/dL   Globulin, Total 2.4 1.5 - 4.5 g/dL   Albumin/Globulin Ratio 1.9 1.2 - 2.2   Bilirubin Total 0.3 0.0 - 1.2 mg/dL   Alkaline Phosphatase 63 44 - 121 IU/L   AST 16 0 - 40 IU/L   ALT 12 0 - 32 IU/L  Hemoglobin A1c     Status: Abnormal   Collection Time: 06/16/22  8:37 AM  Result Value Ref Range   Hgb A1c MFr Bld 5.7 (H) 4.8 -  5.6 %    Comment:          Prediabetes: 5.7 - 6.4          Diabetes: >6.4          Glycemic control for adults with diabetes: <7.0    Est. average glucose Bld gHb  Est-mCnc 117 mg/dL  Lipid panel     Status: None   Collection Time: 06/16/22  8:37 AM  Result Value Ref Range   Cholesterol, Total 172 100 - 199 mg/dL   Triglycerides 71 0 - 149 mg/dL   HDL 60 >16 mg/dL   VLDL Cholesterol Cal 13 5 - 40 mg/dL   LDL Chol Calc (NIH) 99 0 - 99 mg/dL   Chol/HDL Ratio 2.9 0.0 - 4.4 ratio    Comment:                                   T. Chol/HDL Ratio                                             Men  Women                               1/2 Avg.Risk  3.4    3.3                                   Avg.Risk  5.0    4.4                                2X Avg.Risk  9.6    7.1                                3X Avg.Risk 23.4   11.0   Vitamin D (25 hydroxy)     Status: Abnormal   Collection Time: 06/16/22  8:37 AM  Result Value Ref Range   Vit D, 25-Hydroxy 29.8 (L) 30.0 - 100.0 ng/mL    Comment: Vitamin D deficiency has been defined by the Institute of Medicine and an Endocrine Society practice guideline as a level of serum 25-OH vitamin D less than 20 ng/mL (1,2). The Endocrine Society went on to further define vitamin D insufficiency as a level between 21 and 29 ng/mL (2). 1. IOM (Institute of Medicine). 2010. Dietary reference    intakes for calcium and D. [place]: The    [employer]. 2. Holick MF, Binkley Victoria, Bischoff-Ferrari HA, et al.    Evaluation, treatment, and prevention of vitamin D    deficiency: an Endocrine Society clinical practice    guideline. JCEM. 2011 Jul; 96(7):1911-30.   Vitamin B12     Status: None   Collection Time: 06/16/22  8:37 AM  Result Value Ref Range   Vitamin B-12 558 232 - 1,245 pg/mL       Assessment & Plan:   Problem List Items Addressed This Visit     Anxiety state - Primary   Relevant Medications   busPIRone (BUSPAR) 10 MG tablet   PANIC DISORDER  Relevant Medications   busPIRone (BUSPAR) 10 MG tablet   Labs reviewed in detail today.  Patient still pre-diabetic, Cholesterol is good.    Return in about 1 month (around 07/21/2022) for F/U.   Total time spent: 30 minutes  Miki Kins, FNP  06/21/2022

## 2022-06-30 ENCOUNTER — Other Ambulatory Visit: Payer: Self-pay | Admitting: Family

## 2022-06-30 DIAGNOSIS — Z1231 Encounter for screening mammogram for malignant neoplasm of breast: Secondary | ICD-10-CM

## 2022-07-08 ENCOUNTER — Other Ambulatory Visit: Payer: Self-pay

## 2022-07-08 NOTE — Telephone Encounter (Signed)
She has had issues focusing which has been causing her anxiety to be all over the place, if you can send rx Vyvanse in for her to [facility], please advise

## 2022-07-14 MED ORDER — LISDEXAMFETAMINE DIMESYLATE 40 MG PO CAPS: 40.0000 mg | ORAL_CAPSULE | Freq: Every day | ORAL | 0 refills | Status: DC | PRN

## 2022-07-26 ENCOUNTER — Ambulatory Visit
Admission: RE | Admit: 2022-07-26 | Discharge: 2022-07-26 | Disposition: A | Discharge status: Home / Self Care | Payer: 59 | Source: Ambulatory Visit | Attending: Family | Admitting: Family

## 2022-07-26 DIAGNOSIS — Z1231 Encounter for screening mammogram for malignant neoplasm of breast: Secondary | ICD-10-CM | POA: Diagnosis present

## 2022-08-04 ENCOUNTER — Ambulatory Visit (INDEPENDENT_AMBULATORY_CARE_PROVIDER_SITE_OTHER): Payer: 59 | Admitting: Family

## 2022-08-04 VITALS — BP 120/60 | HR 98 | Ht 61.0 in | Wt 120.0 lb

## 2022-08-04 DIAGNOSIS — F900 Attention-deficit hyperactivity disorder, predominantly inattentive type: Secondary | ICD-10-CM | POA: Diagnosis not present

## 2022-08-04 DIAGNOSIS — F410 Panic disorder [episodic paroxysmal anxiety] without agoraphobia: Secondary | ICD-10-CM | POA: Diagnosis not present

## 2022-08-04 DIAGNOSIS — M5136 Other intervertebral disc degeneration, lumbar region: Secondary | ICD-10-CM

## 2022-08-04 DIAGNOSIS — F331 Major depressive disorder, recurrent, moderate: Secondary | ICD-10-CM

## 2022-08-04 DIAGNOSIS — M51369 Other intervertebral disc degeneration, lumbar region without mention of lumbar back pain or lower extremity pain: Secondary | ICD-10-CM

## 2022-08-04 DIAGNOSIS — E782 Mixed hyperlipidemia: Secondary | ICD-10-CM | POA: Diagnosis not present

## 2022-08-04 DIAGNOSIS — F5102 Adjustment insomnia: Secondary | ICD-10-CM

## 2022-08-04 MED ORDER — LISDEXAMFETAMINE DIMESYLATE 50 MG PO CAPS: 50.0000 mg | ORAL_CAPSULE | Freq: Every day | ORAL | 0 refills | Status: DC

## 2022-08-04 NOTE — Progress Notes (Signed)
Established Patient Office Visit  Subjective:  Patient ID: Kara Mayer, female    DOB: 04-24-1965  Age: 57 y.o. MRN: [id number]  Chief Complaint  Patient presents with   Follow-up    Follow up    Patient is here for follow up appointment.  She is doing much better since starting the Vyvanse, and says that her anxiety is much improved as well.   ADHD scale is in chart, shows severe symptoms.  Both of her children also have ADHD diagnoses.   No other concerns at this time.   Past Medical History:  Diagnosis Date   Anxiety    Depression    Osteoarthritis    Right knee & L-Spine    Past Surgical History:  Procedure Laterality Date   ABDOMINAL HYSTERECTOMY     BREAST REDUCTION SURGERY  11/01/2016   REDUCTION MAMMAPLASTY Bilateral 2018    Social History   Socioeconomic History   Marital status: Married    Spouse name: Not on file   Number of children: Not on file   Years of education: Not on file   Highest education level: Not on file  Occupational History   Not on file  Tobacco Use   Smoking status: Never   Smokeless tobacco: Never  Vaping Use   Vaping Use: Never used  Substance and Sexual Activity   Alcohol use: Yes    Alcohol/week: 0.0 standard drinks of alcohol    Comment: on weekends   Drug use: No   Sexual activity: Yes  Other Topics Concern   Not on file  Social History Narrative   Not on file   Social Determinants of Health   Financial Resource Strain: Not on file  Food Insecurity: Not on file  Transportation Needs: Not on file  Physical Activity: Not on file  Stress: Not on file  Social Connections: Not on file  Intimate Partner Violence: Not on file    Family History  Problem Relation Age of Onset   Hypertension Mother    Arthritis Mother    Suicidality Father    Hypertension Brother    Hyperlipidemia Brother    Asthma Daughter    Diabetes Maternal Grandmother    Hypertension Maternal Grandmother    Heart disease Maternal  Grandfather    Diabetes Paternal Grandmother    Breast cancer Neg Hx     No Known Allergies  Review of Systems  All other systems reviewed and are negative.      Objective:   BP 120/60   Pulse 98   Ht 5\' 1"  (1.549 m)   Wt 120 lb (54.4 kg)   SpO2 98%   BMI 22.67 kg/m   Vitals:   08/04/22 1458  BP: 120/60  Pulse: 98  Height: 5\' 1"  (1.549 m)  Weight: 120 lb (54.4 kg)  SpO2: 98%  BMI (Calculated): 22.69    Physical Exam Vitals and nursing note reviewed.  Constitutional:      Appearance: Normal appearance. She is normal weight.  HENT:     Head: Normocephalic.  Eyes:     Pupils: Pupils are equal, round, and reactive to light.  Cardiovascular:     Rate and Rhythm: Normal rate.  Pulmonary:     Effort: Pulmonary effort is normal.  Neurological:     General: No focal deficit present.     Mental Status: She is alert and oriented to person, place, and time. Mental status is at baseline.  Psychiatric:  Mood and Affect: Mood normal.        Behavior: Behavior normal.        Thought Content: Thought content normal.        Judgment: Judgment normal.    Adult ADHD Self Report Scale (most recent)     Adult ADHD Self-Report Scale (ASRS-v1.1) Symptom Checklist - 08/04/22 1500       Part A   1. How often do you have trouble wrapping up the final details of a project, once the challenging parts have been done? Very Often  2. How often do you have difficulty getting things done in order when you have to do a task that requires organization? Often    3. How often do you have problems remembering appointments or obligations? Often  4. When you have a task that requires a lot of thought, how often do you avoid or delay getting started? Very Often    5. How often do you fidget or squirm with your hands or feet when you have to sit down for a long time? Very Often  6. How often do you feel overly active and compelled to do things, like you were driven by a motor? Often       Part B   7. How often do you make careless mistakes when you have to work on a boring or difficult project? Very Often  8. How often do you have difficulty keeping your attention when you are doing boring or repetitive work? Very Often    9. How often do you have difficulty concentrating on what people say to you, even when they are speaking to you directly? Often  10. How often do you misplace or have difficulty finding things at home or at work? Very Often    11. How often are you distracted by activity or noise around you? Very Often  12. How often do you leave your seat in meetings or other situations in which you are expected to remain seated? Sometimes    13. How often do you feel restless or fidgety? Very Often  14. How often do you have difficulty unwinding and relaxing when you have time to yourself? Very Often    15. How often do you find yourself talking too much when you are in social situations? Often  16. When you are in a conversation, how often do you find yourself finishing the sentences of the people you are talking to, before they can finish them themselves? Sometimes    17. How often do you have difficulty waiting your turn in situations when turn taking is required? Sometimes  18. How often do you interrupt others when they are busy? Sometimes      Comment   How old were you when these problems first began to occur? 10   estimated. Patient unsure of exact age.              No results found for any visits on 08/04/22.  Recent Results (from the past 2160 hour(s))  CBC With Differential     Status: Abnormal   Collection Time: 06/16/22  8:37 AM  Result Value Ref Range   WBC 5.6 3.4 - 10.8 x10E3/uL   RBC 4.71 3.77 - 5.28 x10E6/uL   Hemoglobin 12.5 11.1 - 15.9 g/dL   Hematocrit 16.1 09.6 - 46.6 %   MCV 83 79 - 97 fL   MCH 26.5 (L) 26.6 - 33.0 pg   MCHC 32.1 31.5 - 35.7 g/dL  RDW 12.5 11.7 - 15.4 %   Neutrophils 66 Not Estab. %   Lymphs 25 Not Estab. %   Monocytes 7  Not Estab. %   Eos 1 Not Estab. %   Basos 1 Not Estab. %   Neutrophils Absolute 3.7 1.4 - 7.0 x10E3/uL   Lymphocytes Absolute 1.4 0.7 - 3.1 x10E3/uL   Monocytes Absolute 0.4 0.1 - 0.9 x10E3/uL   EOS (ABSOLUTE) 0.1 0.0 - 0.4 x10E3/uL   Basophils Absolute 0.0 0.0 - 0.2 x10E3/uL   Immature Granulocytes 0 Not Estab. %   Immature Grans (Abs) 0.0 0.0 - 0.1 x10E3/uL  CMP14+EGFR     Status: None   Collection Time: 06/16/22  8:37 AM  Result Value Ref Range   Glucose 91 70 - 99 mg/dL   BUN 8 6 - 24 mg/dL   Creatinine, Ser 4.09 0.57 - 1.00 mg/dL   eGFR 80 >81 XB/JYN/8.29   BUN/Creatinine Ratio 9 9 - 23   Sodium 140 134 - 144 mmol/L   Potassium 4.5 3.5 - 5.2 mmol/L   Chloride 102 96 - 106 mmol/L   CO2 24 20 - 29 mmol/L   Calcium 9.6 8.7 - 10.2 mg/dL   Total Protein 6.9 6.0 - 8.5 g/dL   Albumin 4.5 3.8 - 4.9 g/dL   Globulin, Total 2.4 1.5 - 4.5 g/dL   Albumin/Globulin Ratio 1.9 1.2 - 2.2   Bilirubin Total 0.3 0.0 - 1.2 mg/dL   Alkaline Phosphatase 63 44 - 121 IU/L   AST 16 0 - 40 IU/L   ALT 12 0 - 32 IU/L  Hemoglobin A1c     Status: Abnormal   Collection Time: 06/16/22  8:37 AM  Result Value Ref Range   Hgb A1c MFr Bld 5.7 (H) 4.8 - 5.6 %    Comment:          Prediabetes: 5.7 - 6.4          Diabetes: >6.4          Glycemic control for adults with diabetes: <7.0    Est. average glucose Bld gHb Est-mCnc 117 mg/dL  Lipid panel     Status: None   Collection Time: 06/16/22  8:37 AM  Result Value Ref Range   Cholesterol, Total 172 100 - 199 mg/dL   Triglycerides 71 0 - 149 mg/dL   HDL 60 >56 mg/dL   VLDL Cholesterol Cal 13 5 - 40 mg/dL   LDL Chol Calc (NIH) 99 0 - 99 mg/dL   Chol/HDL Ratio 2.9 0.0 - 4.4 ratio    Comment:                                   T. Chol/HDL Ratio                                             Men  Women                               1/2 Avg.Risk  3.4    3.3                                   Avg.Risk  5.0  4.4                                2X Avg.Risk  9.6     7.1                                3X Avg.Risk 23.4   11.0   Vitamin D (25 hydroxy)     Status: Abnormal   Collection Time: 06/16/22  8:37 AM  Result Value Ref Range   Vit D, 25-Hydroxy 29.8 (L) 30.0 - 100.0 ng/mL    Comment: Vitamin D deficiency has been defined by the Institute of Medicine and an Endocrine Society practice guideline as a level of serum 25-OH vitamin D less than 20 ng/mL (1,2). The Endocrine Society went on to further define vitamin D insufficiency as a level between 21 and 29 ng/mL (2). 1. IOM (Institute of Medicine). 2010. Dietary reference    intakes for calcium and D. [place]: The    [employer]. 2. Holick MF, Binkley Mescal, Bischoff-Ferrari HA, et al.    Evaluation, treatment, and prevention of vitamin D    deficiency: an Endocrine Society clinical practice    guideline. JCEM. 2011 Jul; 96(7):1911-30.   Vitamin B12     Status: None   Collection Time: 06/16/22  8:37 AM  Result Value Ref Range   Vitamin B-12 558 232 - 1,245 pg/mL       Assessment & Plan:   Problem List Items Addressed This Visit       Active Problems   HLD (hyperlipidemia)    Checking labs today.  Continue current therapy for lipid control. Will modify as needed based on labwork results.       PANIC DISORDER    Patient stable.  Well controlled with current therapy.   Continue current meds.       Depression, major, recurrent, moderate (HCC)    Patient stable.  Well controlled with current therapy.   Continue current meds.       DDD (degenerative disc disease), lumbar   Adjustment insomnia   ADHD (attention deficit hyperactivity disorder), inattentive type - Primary    Patient is doing well with the Vyvanse, but says that it is starting to fade earlier in the day.  She asks if there is anything we can do about this, so we will increase the dosing to 50mg .   Reassess at follow up to ensure this is helping.        Return in about 3 months (around  11/04/2022) for F/U.   Total time spent: 20 minutes  Miki Kins, FNP  08/04/2022   This document may have been prepared by [facility] Voice Recognition software and as such may include unintentional dictation errors.

## 2022-08-12 ENCOUNTER — Other Ambulatory Visit: Payer: Self-pay | Admitting: Family

## 2022-08-12 DIAGNOSIS — F411 Generalized anxiety disorder: Secondary | ICD-10-CM

## 2022-08-14 ENCOUNTER — Encounter: Payer: Self-pay | Admitting: Family

## 2022-08-14 DIAGNOSIS — F900 Attention-deficit hyperactivity disorder, predominantly inattentive type: Secondary | ICD-10-CM | POA: Insufficient documentation

## 2022-08-14 NOTE — Assessment & Plan Note (Signed)
Patient stable.  Well controlled with current therapy.   Continue current meds.  

## 2022-08-14 NOTE — Assessment & Plan Note (Signed)
Patient is doing well with the Vyvanse, but says that it is starting to fade earlier in the day.  She asks if there is anything we can do about this, so we will increase the dosing to 50mg .   Reassess at follow up to ensure this is helping.

## 2022-08-14 NOTE — Assessment & Plan Note (Signed)
Checking labs today.  Continue current therapy for lipid control. Will modify as needed based on labwork results.  

## 2022-08-31 ENCOUNTER — Other Ambulatory Visit: Payer: Self-pay | Admitting: Family

## 2022-08-31 MED ORDER — LISDEXAMFETAMINE DIMESYLATE 50 MG PO CAPS: 50.0000 mg | ORAL_CAPSULE | Freq: Every day | ORAL | 0 refills | Status: DC

## 2022-11-04 ENCOUNTER — Ambulatory Visit: Payer: Managed Care, Other (non HMO) | Admitting: Family

## 2022-12-18 IMAGING — MG MM DIGITAL SCREENING BILAT W/ TOMO AND CAD
8 series · 8 of 24 positions shown · non-contrast
Comparison: Previous exam(s).

CLINICAL DATA: Screening.

EXAM:
DIGITAL SCREENING BILATERAL MAMMOGRAM WITH TOMOSYNTHESIS AND CAD
TECHNIQUE: Bilateral screening digital craniocaudal and mediolateral oblique
mammograms were obtained. Bilateral screening digital breast
tomosynthesis was performed. The images were evaluated with
computer-aided detection.

[R MLO synth-2D]
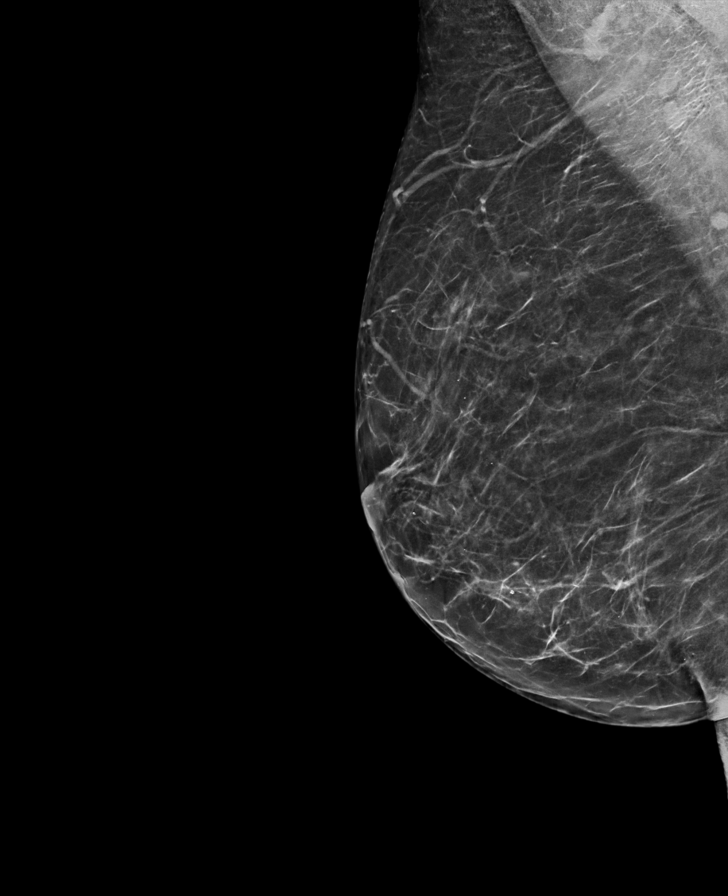

[L CC synth-2D]
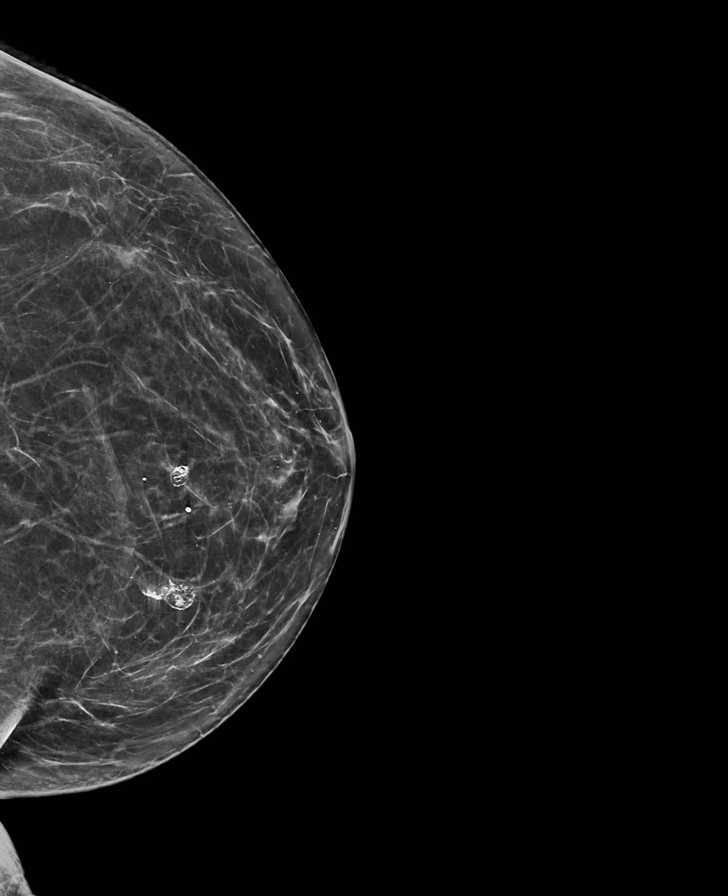

[R CC synth-2D]
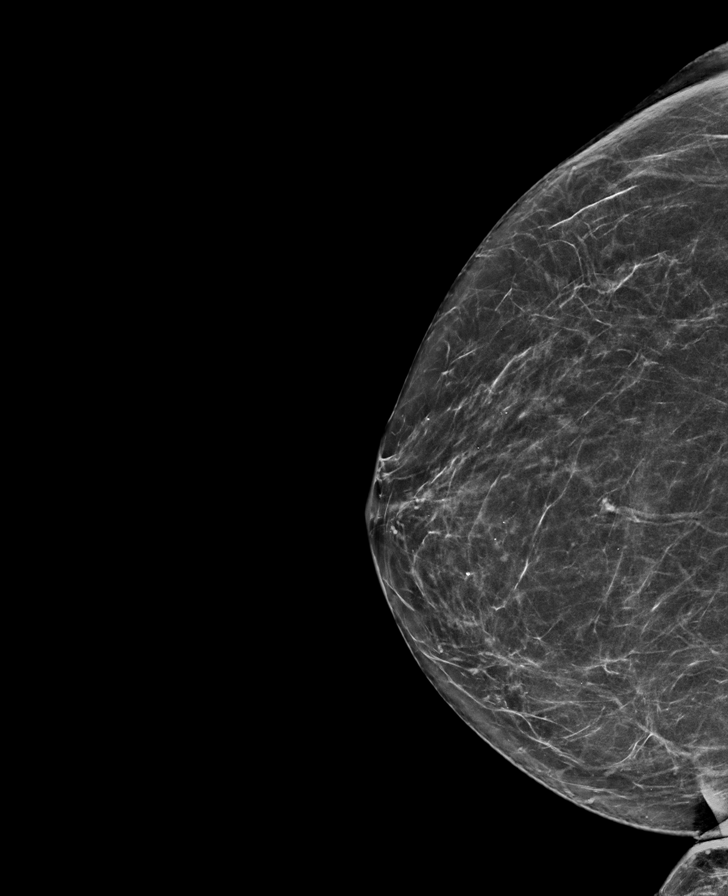

[L MLO synth-2D]
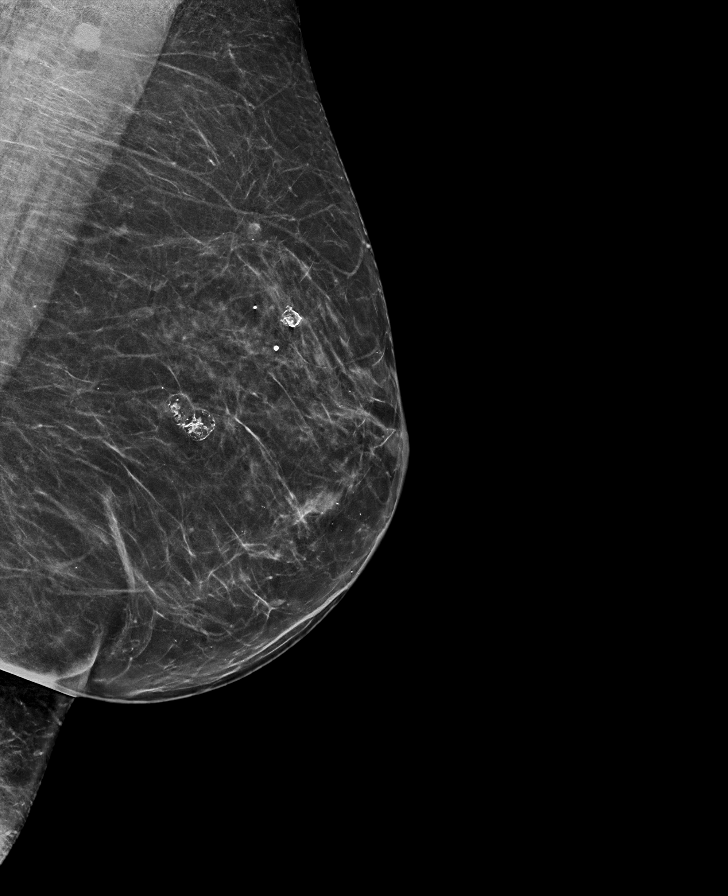

[L MLO tomo · tomo slice 37/72.0]
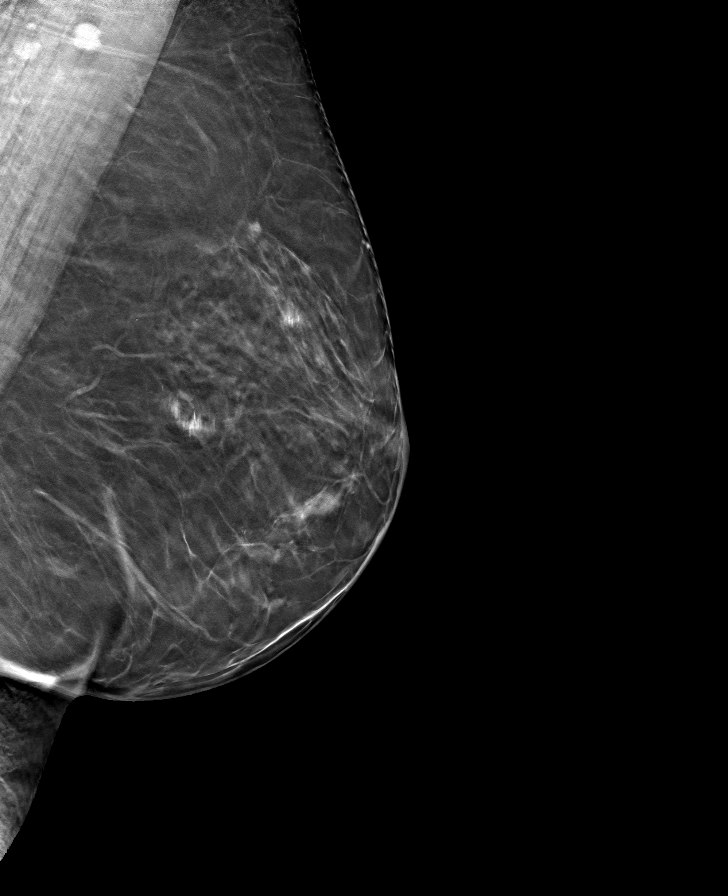

[L CC tomo · tomo slice 37/72.0]
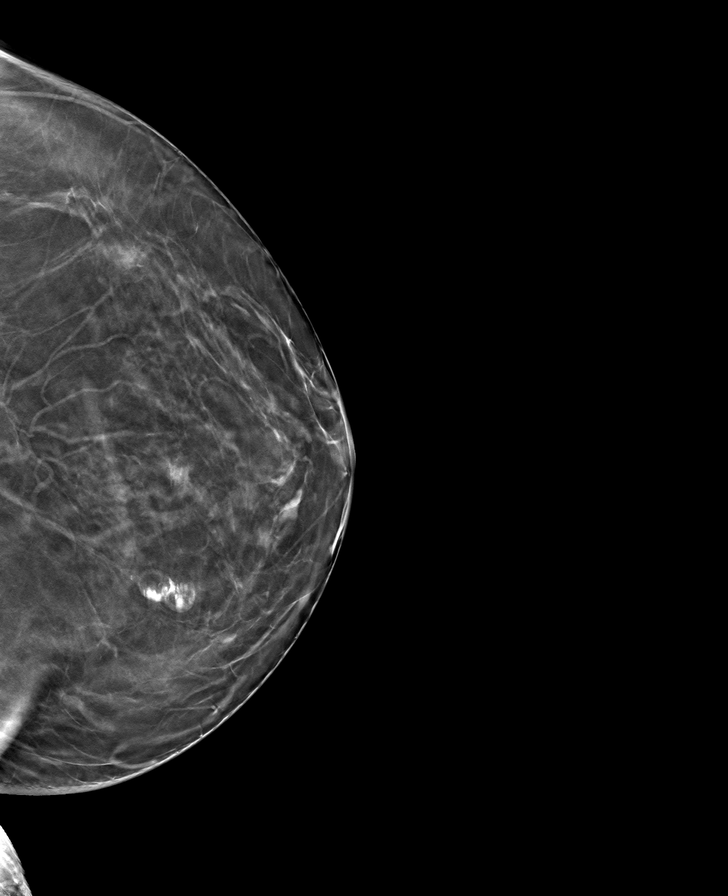

[R MLO tomo · tomo slice 38/75.0]
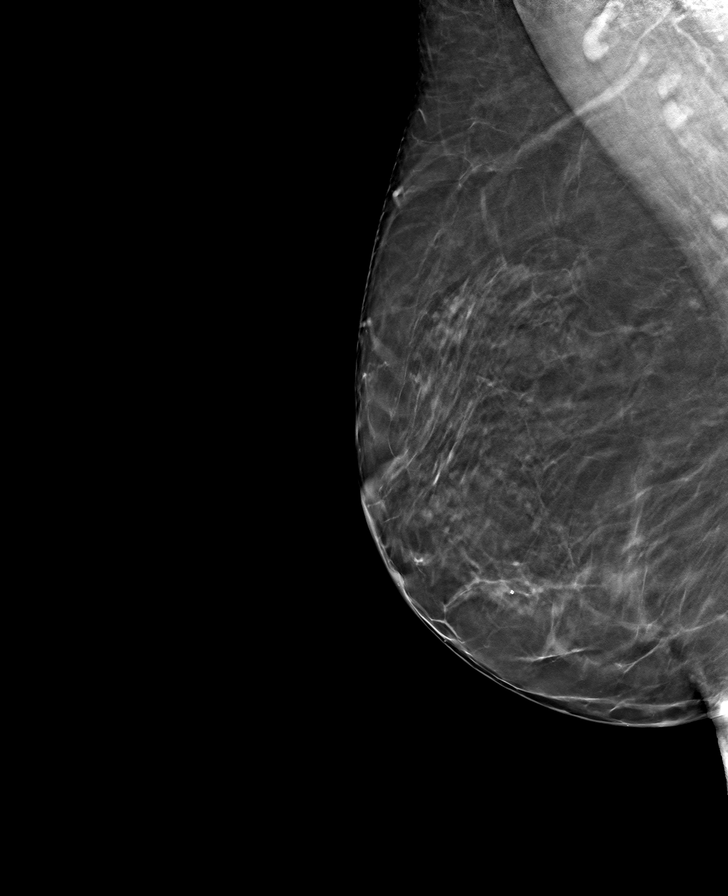

[R CC tomo · tomo slice 35/70.0]
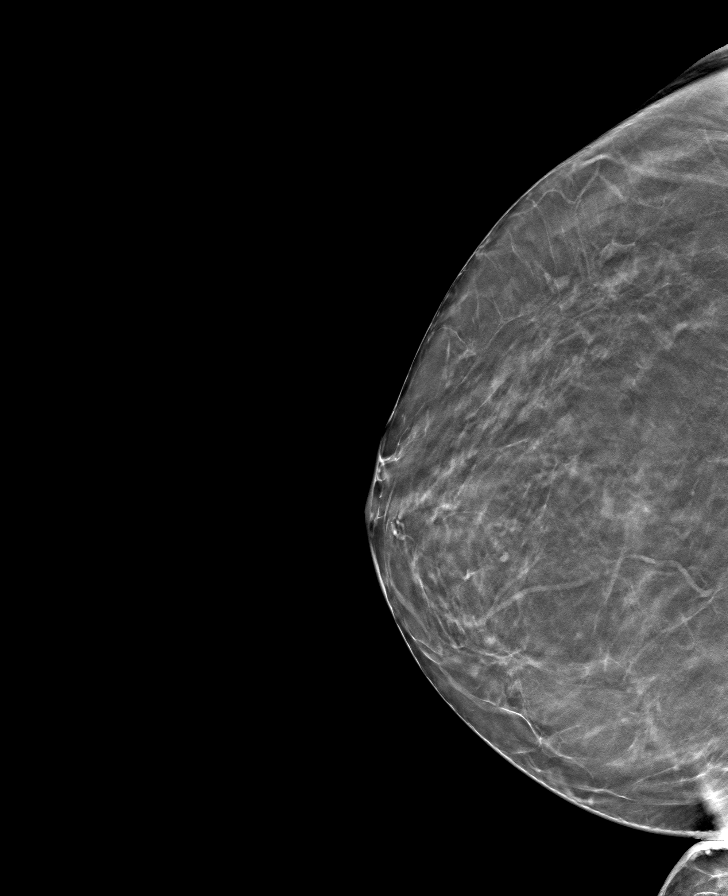

[8 of 24 positions shown; findings below may reference images not displayed]

ACR Breast Density Category b: There are scattered areas of
fibroglandular density.
FINDINGS: There are no findings suspicious for malignancy.
IMPRESSION: No mammographic evidence of malignancy. A result letter of this
screening mammogram will be mailed directly to the patient.

RECOMMENDATION:
Screening mammogram in one year. (Code:51-O-LD2)

BI-RADS CATEGORY  1: Negative.

## 2023-01-02 ENCOUNTER — Encounter: Payer: Self-pay | Admitting: Family

## 2023-01-02 ENCOUNTER — Ambulatory Visit (INDEPENDENT_AMBULATORY_CARE_PROVIDER_SITE_OTHER): Payer: BC Managed Care – PPO | Admitting: Family

## 2023-01-02 VITALS — BP 120/70 | HR 71 | Ht 61.0 in | Wt 129.8 lb

## 2023-01-02 DIAGNOSIS — F900 Attention-deficit hyperactivity disorder, predominantly inattentive type: Secondary | ICD-10-CM | POA: Diagnosis not present

## 2023-01-02 DIAGNOSIS — E782 Mixed hyperlipidemia: Secondary | ICD-10-CM

## 2023-01-02 DIAGNOSIS — M19042 Primary osteoarthritis, left hand: Secondary | ICD-10-CM

## 2023-01-02 DIAGNOSIS — F331 Major depressive disorder, recurrent, moderate: Secondary | ICD-10-CM | POA: Diagnosis not present

## 2023-01-02 DIAGNOSIS — M19041 Primary osteoarthritis, right hand: Secondary | ICD-10-CM | POA: Diagnosis not present

## 2023-01-02 MED ORDER — LISDEXAMFETAMINE DIMESYLATE 50 MG PO CAPS: 50.0000 mg | ORAL_CAPSULE | Freq: Every day | ORAL | 0 refills | Status: DC

## 2023-01-02 NOTE — Progress Notes (Signed)
Established Patient Office Visit  Subjective:  Patient ID: Kara Mayer, female    DOB: Feb 04, 1966  Age: 57 y.o. MRN: [id number]  Chief Complaint  Patient presents with   Follow-up    F/U    Patient is here today for her 3 months follow up.  She has been feeling fairly well since last appointment.   She does not have additional concerns to discuss today.  Labs are not due today. She needs refills.   I have reviewed her active problem list, medication list, allergies, notes from last encounter, lab results for her appointment today.      No other concerns at this time.   Past Medical History:  Diagnosis Date   Anxiety    Depression    Osteoarthritis    Right knee & L-Spine    Past Surgical History:  Procedure Laterality Date   ABDOMINAL HYSTERECTOMY     BREAST REDUCTION SURGERY  11/01/2016   REDUCTION MAMMAPLASTY Bilateral 2018    Social History   Socioeconomic History   Marital status: Married    Spouse name: Not on file   Number of children: Not on file   Years of education: Not on file   Highest education level: Not on file  Occupational History   Not on file  Tobacco Use   Smoking status: Never   Smokeless tobacco: Never  Vaping Use   Vaping status: Never Used  Substance and Sexual Activity   Alcohol use: Yes    Alcohol/week: 0.0 standard drinks of alcohol    Comment: on weekends   Drug use: No   Sexual activity: Yes  Other Topics Concern   Not on file  Social History Narrative   Not on file   Social Determinants of Health   Financial Resource Strain: Not on file  Food Insecurity: Not on file  Transportation Needs: Not on file  Physical Activity: Not on file  Stress: Not on file  Social Connections: Not on file  Intimate Partner Violence: Not on file    Family History  Problem Relation Age of Onset   Hypertension Mother    Arthritis Mother    Suicidality Father    Hypertension Brother    Hyperlipidemia Brother    Asthma  Daughter    Diabetes Maternal Grandmother    Hypertension Maternal Grandmother    Heart disease Maternal Grandfather    Diabetes Paternal Grandmother    Breast cancer Neg Hx     No Known Allergies  Review of Systems  All other systems reviewed and are negative.      Objective:   BP 120/70   Pulse 71   Ht 5\' 1"  (1.549 m)   Wt 129 lb 12.8 oz (58.9 kg)   SpO2 98%   BMI 24.53 kg/m   Vitals:   01/02/23 1132  BP: 120/70  Pulse: 71  Height: 5\' 1"  (1.549 m)  Weight: 129 lb 12.8 oz (58.9 kg)  SpO2: 98%  BMI (Calculated): 24.54    Physical Exam Vitals and nursing note reviewed.  Constitutional:      Appearance: Normal appearance. She is normal weight.  HENT:     Head: Normocephalic.  Eyes:     Extraocular Movements: Extraocular movements intact.     Conjunctiva/sclera: Conjunctivae normal.     Pupils: Pupils are equal, round, and reactive to light.  Cardiovascular:     Rate and Rhythm: Normal rate.  Pulmonary:     Effort: Pulmonary effort is normal.  Neurological:     General: No focal deficit present.     Mental Status: She is alert and oriented to person, place, and time. Mental status is at baseline.  Psychiatric:        Mood and Affect: Mood normal.        Behavior: Behavior normal.        Thought Content: Thought content normal.        Judgment: Judgment normal.     Comments: Mood is much improved, patient has a new job and is much less stressed.       No results found for any visits on 01/02/23.  No results found for this or any previous visit (from the past 2160 hour(s)).     Assessment & Plan:   Problem List Items Addressed This Visit       Active Problems   HLD (hyperlipidemia)   Depression, major, recurrent, moderate (HCC)    Patient stable.  Well controlled with current therapy.   Continue current meds.       ADHD (attention deficit hyperactivity disorder), inattentive type - Primary    Patient stable.  Well controlled with current  therapy.   Continue current meds.       Osteoarthritis of both hands    Suggested voltaren gel, compression gloves, and/or glucosamine and turmeric.  Pt can also take Tylenol, already taking celebrex.   Reassess at follow up.        Return in about 3 months (around 04/04/2023) for F/U.   Total time spent: 20 minutes  Miki Kins, FNP  01/02/2023   This document may have been prepared by [facility] Voice Recognition software and as such may include unintentional dictation errors.

## 2023-01-02 NOTE — Assessment & Plan Note (Signed)
Suggested voltaren gel, compression gloves, and/or glucosamine and turmeric.  Pt can also take Tylenol, already taking celebrex.   Reassess at follow up.

## 2023-01-02 NOTE — Assessment & Plan Note (Signed)
Patient stable.  Well controlled with current therapy.   Continue current meds.  

## 2023-02-07 ENCOUNTER — Other Ambulatory Visit: Payer: Self-pay | Admitting: Family

## 2023-02-07 DIAGNOSIS — F900 Attention-deficit hyperactivity disorder, predominantly inattentive type: Secondary | ICD-10-CM

## 2023-02-08 ENCOUNTER — Other Ambulatory Visit: Payer: Self-pay

## 2023-02-10 ENCOUNTER — Other Ambulatory Visit: Payer: Self-pay

## 2023-02-10 MED ORDER — LISDEXAMFETAMINE DIMESYLATE 50 MG PO CAPS: 50.0000 mg | ORAL_CAPSULE | Freq: Every day | ORAL | 0 refills | Status: DC

## 2023-03-24 ENCOUNTER — Other Ambulatory Visit: Payer: Self-pay | Admitting: Family

## 2023-03-24 DIAGNOSIS — F900 Attention-deficit hyperactivity disorder, predominantly inattentive type: Secondary | ICD-10-CM

## 2023-03-24 MED ORDER — LISDEXAMFETAMINE DIMESYLATE 50 MG PO CAPS: 50.0000 mg | ORAL_CAPSULE | Freq: Every day | ORAL | 0 refills | Status: DC

## 2023-04-04 ENCOUNTER — Encounter: Payer: Self-pay | Admitting: Family

## 2023-04-04 ENCOUNTER — Ambulatory Visit (INDEPENDENT_AMBULATORY_CARE_PROVIDER_SITE_OTHER): Payer: BC Managed Care – PPO | Admitting: Family

## 2023-04-04 VITALS — BP 104/80 | HR 77 | Ht 61.0 in | Wt 127.4 lb

## 2023-04-04 DIAGNOSIS — R7303 Prediabetes: Secondary | ICD-10-CM

## 2023-04-04 DIAGNOSIS — E538 Deficiency of other specified B group vitamins: Secondary | ICD-10-CM | POA: Diagnosis not present

## 2023-04-04 DIAGNOSIS — R5383 Other fatigue: Secondary | ICD-10-CM

## 2023-04-04 DIAGNOSIS — E039 Hypothyroidism, unspecified: Secondary | ICD-10-CM

## 2023-04-04 DIAGNOSIS — F900 Attention-deficit hyperactivity disorder, predominantly inattentive type: Secondary | ICD-10-CM

## 2023-04-04 DIAGNOSIS — Z1159 Encounter for screening for other viral diseases: Secondary | ICD-10-CM

## 2023-04-04 DIAGNOSIS — I10 Essential (primary) hypertension: Secondary | ICD-10-CM

## 2023-04-04 DIAGNOSIS — E559 Vitamin D deficiency, unspecified: Secondary | ICD-10-CM

## 2023-04-04 DIAGNOSIS — Z1211 Encounter for screening for malignant neoplasm of colon: Secondary | ICD-10-CM

## 2023-04-04 DIAGNOSIS — E782 Mixed hyperlipidemia: Secondary | ICD-10-CM

## 2023-04-04 MED ORDER — ALPRAZOLAM 0.25 MG PO TABS: ORAL_TABLET | ORAL | 2 refills | Status: DC

## 2023-04-04 NOTE — Assessment & Plan Note (Signed)
Patient stable.  Well controlled with current therapy.   Continue current meds.

## 2023-04-04 NOTE — Progress Notes (Signed)
Established Patient Office Visit  Subjective:  Patient ID: Kara Mayer, female    DOB: Nov 23, 1965  Age: 58 y.o. MRN: [id number]  Chief Complaint  Patient presents with   Follow-up    3 month follow up    Patient is here today for her 3 months follow up.  She has been feeling fairly well since last appointment.   She does have additional concerns to discuss today.  Has been doing much better, anxiety has been decreased significantly with her change of jobs.    Labs are due today. She needs refills.   I have reviewed her active problem list, medication list, allergies, notes from last encounter, lab results for her appointment today.      No other concerns at this time.   Past Medical History:  Diagnosis Date   Anxiety    Depression    Osteoarthritis    Right knee & L-Spine    Past Surgical History:  Procedure Laterality Date   ABDOMINAL HYSTERECTOMY     BREAST REDUCTION SURGERY  11/01/2016   REDUCTION MAMMAPLASTY Bilateral 2018    Social History   Socioeconomic History   Marital status: Married    Spouse name: Not on file   Number of children: Not on file   Years of education: Not on file   Highest education level: Not on file  Occupational History   Not on file  Tobacco Use   Smoking status: Never   Smokeless tobacco: Never  Vaping Use   Vaping status: Never Used  Substance and Sexual Activity   Alcohol use: Yes    Alcohol/week: 0.0 standard drinks of alcohol    Comment: on weekends   Drug use: No   Sexual activity: Yes  Other Topics Concern   Not on file  Social History Narrative   Not on file   Social Drivers of Health   Financial Resource Strain: Not on file  Food Insecurity: Not on file  Transportation Needs: Not on file  Physical Activity: Not on file  Stress: Not on file  Social Connections: Not on file  Intimate Partner Violence: Not on file    Family History  Problem Relation Age of Onset   Hypertension Mother    Arthritis  Mother    Suicidality Father    Hypertension Brother    Hyperlipidemia Brother    Asthma Daughter    Diabetes Maternal Grandmother    Hypertension Maternal Grandmother    Heart disease Maternal Grandfather    Diabetes Paternal Grandmother    Breast cancer Neg Hx     No Known Allergies  Review of Systems  All other systems reviewed and are negative.      Objective:   BP 104/80   Pulse 77   Ht 5\' 1"  (1.549 m)   Wt 127 lb 6.4 oz (57.8 kg)   SpO2 99%   BMI 24.07 kg/m   Vitals:   04/04/23 0903  BP: 104/80  Pulse: 77  Height: 5\' 1"  (1.549 m)  Weight: 127 lb 6.4 oz (57.8 kg)  SpO2: 99%  BMI (Calculated): 24.08    Physical Exam Vitals and nursing note reviewed.  Constitutional:      Appearance: Normal appearance. She is normal weight.  HENT:     Head: Normocephalic.  Eyes:     Extraocular Movements: Extraocular movements intact.     Conjunctiva/sclera: Conjunctivae normal.     Pupils: Pupils are equal, round, and reactive to light.  Cardiovascular:  Rate and Rhythm: Normal rate.  Pulmonary:     Effort: Pulmonary effort is normal.  Neurological:     General: No focal deficit present.     Mental Status: She is alert and oriented to person, place, and time. Mental status is at baseline.  Psychiatric:        Mood and Affect: Mood normal.        Behavior: Behavior normal.        Thought Content: Thought content normal.        Judgment: Judgment normal.      No results found for any visits on 04/04/23.  No results found for this or any previous visit (from the past 2160 hours).     Assessment & Plan:   Problem List Items Addressed This Visit       Other   HLD (hyperlipidemia) - Primary   Checking labs today.  Continue current therapy for lipid control. Will modify as needed based on labwork results.        Relevant Orders   Lipid panel   CMP14+EGFR   CBC with Diff   ADHD (attention deficit hyperactivity disorder), inattentive type    Patient stable.  Well controlled with current therapy.   Continue current meds.        Other Visit Diagnoses       B12 deficiency due to diet       Checking labs today.  Will continue supplements as needed.   Relevant Orders   CMP14+EGFR   Vitamin B12   CBC with Diff     Prediabetes       A1C is in prediabetic ranges. Patient counseled on dietary choices and verbalized understanding. Will reassess at follow up after next lab check.   Relevant Orders   CMP14+EGFR   Hemoglobin A1c   CBC with Diff     Vitamin D deficiency, unspecified       Checking labs today.  Will continue supplements as needed.   Relevant Orders   VITAMIN D 25 Hydroxy (Vit-D Deficiency, Fractures)   CMP14+EGFR   CBC with Diff     Other fatigue       Relevant Orders   CMP14+EGFR   TSH   CBC with Diff     Essential hypertension, benign       Blood pressure well controlled with current medications.  Continue current therapy.  Will reassess at follow up.   Relevant Orders   CMP14+EGFR   CBC with Diff     Hypothyroidism (acquired)       Relevant Orders   CMP14+EGFR   CBC with Diff     Need for hepatitis C screening test       Test ordered in office today. Will call with results.   Relevant Orders   CMP14+EGFR   CBC with Diff   Hepatitis C Ab reflex to Quant PCR     Colon cancer screening       New order for Cologuard sent.  Patient is aware of completion instructions. Will call with results when available   Relevant Orders   Cologuard       Return in about 3 months (around 07/03/2023) for F/U.   Total time spent: 30 minutes  Miki Kins, FNP  04/04/2023   This document may have been prepared by [facility] Voice Recognition software and as such may include unintentional dictation errors.

## 2023-04-04 NOTE — Addendum Note (Signed)
Addended by: Grayling Congress on: 04/04/2023 02:47 PM   Modules accepted: Orders

## 2023-04-04 NOTE — Assessment & Plan Note (Signed)
Checking labs today.  Continue current therapy for lipid control. Will modify as needed based on labwork results.

## 2023-04-05 LAB — CMP14+EGFR
ALT: 15 [IU]/L (ref 0–32)
AST: 19 [IU]/L (ref 0–40)
Albumin: 4.4 g/dL (ref 3.8–4.9)
Alkaline Phosphatase: 64 [IU]/L (ref 44–121)
BUN/Creatinine Ratio: 16 (ref 9–23)
BUN: 13 mg/dL (ref 6–24)
Bilirubin Total: 0.2 mg/dL (ref 0.0–1.2)
CO2: 25 mmol/L (ref 20–29)
Calcium: 9.3 mg/dL (ref 8.7–10.2)
Chloride: 102 mmol/L (ref 96–106)
Creatinine, Ser: 0.83 mg/dL (ref 0.57–1.00)
Globulin, Total: 2.8 g/dL (ref 1.5–4.5)
Glucose: 80 mg/dL (ref 70–99)
Potassium: 4.5 mmol/L (ref 3.5–5.2)
Sodium: 139 mmol/L (ref 134–144)
Total Protein: 7.2 g/dL (ref 6.0–8.5)
eGFR: 82 mL/min/{1.73_m2} (ref >59)

## 2023-04-05 LAB — CBC WITH DIFFERENTIAL/PLATELET
Basophils Absolute: 0.1 10*3/uL (ref 0.0–0.2)
Basos: 1 %
EOS (ABSOLUTE): 0.2 10*3/uL (ref 0.0–0.4)
Eos: 3 %
Hematocrit: 39 % (ref 34.0–46.6)
Hemoglobin: 12.1 g/dL (ref 11.1–15.9)
Immature Grans (Abs): 0 10*3/uL (ref 0.0–0.1)
Immature Granulocytes: 0 %
Lymphocytes Absolute: 3.1 10*3/uL (ref 0.7–3.1)
Lymphs: 41 %
MCH: 26.3 pg — ABNORMAL LOW (ref 26.6–33.0)
MCHC: 31 g/dL — ABNORMAL LOW (ref 31.5–35.7)
MCV: 85 fL (ref 79–97)
Monocytes Absolute: 0.4 10*3/uL (ref 0.1–0.9)
Monocytes: 5 %
Neutrophils Absolute: 3.8 10*3/uL (ref 1.4–7.0)
Neutrophils: 50 %
Platelets: 418 10*3/uL (ref 150–450)
RBC: 4.6 x10E6/uL (ref 3.77–5.28)
RDW: 12.9 % (ref 11.7–15.4)
WBC: 7.7 10*3/uL (ref 3.4–10.8)

## 2023-04-05 LAB — VITAMIN D 25 HYDROXY (VIT D DEFICIENCY, FRACTURES): Vit D, 25-Hydroxy: 28.1 ng/mL — ABNORMAL LOW (ref 30.0–100.0)

## 2023-04-05 LAB — LIPID PANEL
Chol/HDL Ratio: 2.5 {ratio} (ref 0.0–4.4)
Cholesterol, Total: 194 mg/dL (ref 100–199)
HDL: 79 mg/dL (ref >39)
LDL Chol Calc (NIH): 105 mg/dL — ABNORMAL HIGH (ref 0–99)
Triglycerides: 54 mg/dL (ref 0–149)
VLDL Cholesterol Cal: 10 mg/dL (ref 5–40)

## 2023-04-05 LAB — HCV INTERPRETATION

## 2023-04-05 LAB — HEMOGLOBIN A1C
Est. average glucose Bld gHb Est-mCnc: 114 mg/dL
Hgb A1c MFr Bld: 5.6 % (ref 4.8–5.6)

## 2023-04-05 LAB — HCV AB W REFLEX TO QUANT PCR: HCV Ab: NONREACTIVE

## 2023-04-05 LAB — VITAMIN B12: Vitamin B-12: 532 pg/mL (ref 232–1245)

## 2023-04-05 LAB — TSH: TSH: 1.57 u[IU]/mL (ref 0.450–4.500)

## 2023-04-09 ENCOUNTER — Encounter: Payer: Self-pay | Admitting: Family

## 2023-05-01 ENCOUNTER — Ambulatory Visit (INDEPENDENT_AMBULATORY_CARE_PROVIDER_SITE_OTHER): Payer: BC Managed Care – PPO

## 2023-05-01 ENCOUNTER — Encounter: Payer: Self-pay | Admitting: Podiatry

## 2023-05-01 ENCOUNTER — Ambulatory Visit: Payer: BC Managed Care – PPO | Admitting: Podiatry

## 2023-05-01 DIAGNOSIS — G5782 Other specified mononeuropathies of left lower limb: Secondary | ICD-10-CM

## 2023-05-01 DIAGNOSIS — M2042 Other hammer toe(s) (acquired), left foot: Secondary | ICD-10-CM

## 2023-05-01 MED ORDER — TRIAMCINOLONE ACETONIDE 40 MG/ML IJ SUSP
20.0000 mg | Freq: Once | INTRAMUSCULAR | Status: AC
  Administered 2023-05-01: 20 mg

## 2023-05-02 NOTE — Progress Notes (Signed)
  Subjective:  Patient ID: Kara Mayer, female    DOB: 1965-09-02,  MRN: [id number] HPI Chief Complaint  Patient presents with   Toe Pain    4th toe left - tingling, numbness x 1 month, initially intermittent, but now more frequent, tried changing shoes-no help   New Patient (Initial Visit)    Est pt 2021    58 y.o. female presents with the above complaint.   ROS: Denies fever chills nausea vomiting muscle aches pains calf pain back pain chest pain shortness of breath.  Past Medical History:  Diagnosis Date   Anxiety    Depression    Osteoarthritis    Right knee & L-Spine   Past Surgical History:  Procedure Laterality Date   ABDOMINAL HYSTERECTOMY     BREAST REDUCTION SURGERY  11/01/2016   REDUCTION MAMMAPLASTY Bilateral 2018    Current Outpatient Medications:    ALPRAZolam (XANAX) 0.25 MG tablet, TAKE 1/2 TO 1 TABLET BY MOUTH EVERY 8 HOURS AS NEEDED, Disp: 30 tablet, Rfl: 2   busPIRone (BUSPAR) 10 MG tablet, TAKE 1 TABLET(10 MG) BY MOUTH TWICE DAILY, Disp: 60 tablet, Rfl: 1   celecoxib (CELEBREX) 100 MG capsule, Take 1 tablet by mouth daily., Disp: , Rfl:    lisdexamfetamine (VYVANSE) 50 MG capsule, Take 1 capsule (50 mg total) by mouth daily., Disp: 30 capsule, Rfl: 0  No Known Allergies Review of Systems Objective:  There were no vitals filed for this visit.  General: Well developed, nourished, in no acute distress, alert and oriented x3   Dermatological: Skin is warm, dry and supple bilateral. Nails x 10 are well maintained; remaining integument appears unremarkable at this time. There are no open sores, no preulcerative lesions, no rash or signs of infection present.  Vascular: Dorsalis Pedis artery and Posterior Tibial artery pedal pulses are 2/4 bilateral with immedate capillary fill time. Pedal hair growth present. No varicosities and no lower extremity edema present bilateral.   Neruologic: Grossly intact via light touch bilateral. Vibratory intact via tuning  fork bilateral. Protective threshold with Semmes Wienstein monofilament intact to all pedal sites bilateral. Patellar and Achilles deep tendon reflexes 2+ bilateral. No Babinski or clonus noted bilateral.  Palpable Mulder's click third interdigital space of the left foot.  Musculoskeletal: No gross boney pedal deformities bilateral. No pain, crepitus, or limitation noted with foot and ankle range of motion bilateral. Muscular strength 5/5 in all groups tested bilateral.  Gait: Unassisted, Nonantalgic.    Radiographs:  Radiographs taken today demonstrate osseously mature individual good bone mineralization.  No acute findings noted.  Assessment & Plan:   Assessment: Neuroma third interdigital space left foot.  Plan: Injected 15 mg of Kenalog to the point of maximal tenderness with local anesthetic third interspace of the left foot.  Discussed appropriate shoe gear with her we will follow-up with her in the near future.     Sihaam Chrobak T. [place], [place]

## 2023-05-05 ENCOUNTER — Other Ambulatory Visit: Payer: Self-pay | Admitting: Family

## 2023-05-05 DIAGNOSIS — F900 Attention-deficit hyperactivity disorder, predominantly inattentive type: Secondary | ICD-10-CM

## 2023-05-08 MED ORDER — LISDEXAMFETAMINE DIMESYLATE 50 MG PO CAPS: 50.0000 mg | ORAL_CAPSULE | Freq: Every day | ORAL | 0 refills | Status: DC

## 2023-06-16 ENCOUNTER — Other Ambulatory Visit: Payer: Self-pay

## 2023-06-19 MED ORDER — LISDEXAMFETAMINE DIMESYLATE 40 MG PO CAPS: 40.0000 mg | ORAL_CAPSULE | ORAL | 0 refills | Status: DC

## 2023-07-03 ENCOUNTER — Ambulatory Visit: Payer: BC Managed Care – PPO | Admitting: Family

## 2023-07-10 ENCOUNTER — Encounter: Payer: Self-pay | Admitting: Family

## 2023-07-10 ENCOUNTER — Ambulatory Visit (INDEPENDENT_AMBULATORY_CARE_PROVIDER_SITE_OTHER): Admitting: Family

## 2023-07-10 VITALS — BP 140/90 | HR 79 | Ht 61.0 in | Wt 124.0 lb

## 2023-07-10 DIAGNOSIS — E782 Mixed hyperlipidemia: Secondary | ICD-10-CM

## 2023-07-10 DIAGNOSIS — F900 Attention-deficit hyperactivity disorder, predominantly inattentive type: Secondary | ICD-10-CM | POA: Diagnosis not present

## 2023-07-10 DIAGNOSIS — F411 Generalized anxiety disorder: Secondary | ICD-10-CM | POA: Diagnosis not present

## 2023-07-10 MED ORDER — LISDEXAMFETAMINE DIMESYLATE 30 MG PO CAPS: 30.0000 mg | ORAL_CAPSULE | Freq: Every day | ORAL | 0 refills | Status: DC

## 2023-07-10 MED ORDER — BUSPIRONE HCL 10 MG PO TABS: 10.0000 mg | ORAL_TABLET | Freq: Two times a day (BID) | ORAL | 1 refills | Status: DC

## 2023-07-10 NOTE — Progress Notes (Signed)
 Established Patient Office Visit  Subjective:  Patient ID: Kara Mayer, female    DOB: Jul 29, 1965  Age: 58 y.o. MRN: [id number]  Chief Complaint  Patient presents with   Follow-up    3 month follow up    Patient is here today for her 3 months follow up.  She has been feeling fairly well since last appointment.   She does not have additional concerns to discuss today.  She asks if it would be possible to decrease the vyvanse  dose a little further, as she has been having a little bit more anxiety.  She is doing well otherwise.   Labs are not due today. She needs refills.   I have reviewed her active problem list, medication list, allergies, notes from last encounter, lab results for her appointment today.    No other concerns at this time.   Past Medical History:  Diagnosis Date   Anxiety    Depression    Osteoarthritis    Right knee & L-Spine    Past Surgical History:  Procedure Laterality Date   ABDOMINAL HYSTERECTOMY     BREAST REDUCTION SURGERY  11/01/2016   REDUCTION MAMMAPLASTY Bilateral 2018    Social History   Socioeconomic History   Marital status: Married    Spouse name: Not on file   Number of children: Not on file   Years of education: Not on file   Highest education level: Not on file  Occupational History   Not on file  Tobacco Use   Smoking status: Never   Smokeless tobacco: Never  Vaping Use   Vaping status: Never Used  Substance and Sexual Activity   Alcohol use: Yes    Alcohol/week: 0.0 standard drinks of alcohol    Comment: on weekends   Drug use: No   Sexual activity: Yes  Other Topics Concern   Not on file  Social History Narrative   Not on file   Social Drivers of Health   Financial Resource Strain: Not on file  Food Insecurity: Not on file  Transportation Needs: Not on file  Physical Activity: Not on file  Stress: Not on file  Social Connections: Not on file  Intimate Partner Violence: Not on file    Family History   Problem Relation Age of Onset   Hypertension Mother    Arthritis Mother    Suicidality Father    Hypertension Brother    Hyperlipidemia Brother    Asthma Daughter    Diabetes Maternal Grandmother    Hypertension Maternal Grandmother    Heart disease Maternal Grandfather    Diabetes Paternal Grandmother    Breast cancer Neg Hx     No Known Allergies  Review of Systems  Psychiatric/Behavioral:  The patient is nervous/anxious.   All other systems reviewed and are negative.      Objective:   BP (!) 140/90   Pulse 79   Ht 5' 1 (1.549 m)   Wt 124 lb (56.2 kg)   SpO2 99%   BMI 23.43 kg/m   Vitals:   07/10/23 1134  BP: (!) 140/90  Pulse: 79  Height: 5' 1 (1.549 m)  Weight: 124 lb (56.2 kg)  SpO2: 99%  BMI (Calculated): 23.44    Physical Exam Vitals and nursing note reviewed.  Constitutional:      Appearance: Normal appearance. She is normal weight.  HENT:     Head: Normocephalic.  Eyes:     Extraocular Movements: Extraocular movements intact.  Conjunctiva/sclera: Conjunctivae normal.     Pupils: Pupils are equal, round, and reactive to light.  Cardiovascular:     Rate and Rhythm: Normal rate.  Pulmonary:     Effort: Pulmonary effort is normal.  Neurological:     General: No focal deficit present.     Mental Status: She is alert and oriented to person, place, and time. Mental status is at baseline.  Psychiatric:        Mood and Affect: Mood normal.        Behavior: Behavior normal.        Thought Content: Thought content normal.        Judgment: Judgment normal.      No results found for any visits on 07/10/23.  No results found for this or any previous visit (from the past 2160 hours).     Assessment & Plan Anxiety state Patient stable.  Well controlled with current therapy.   Continue current meds.   Meds ordered this encounter  Medications   busPIRone  (BUSPAR ) 10 MG tablet    Sig: Take 1 tablet (10 mg total) by mouth 2 (two) times  daily.    Dispense:  60 tablet    Refill:  1   lisdexamfetamine (VYVANSE ) 30 MG capsule    Sig: Take 1 capsule (30 mg total) by mouth daily.    Dispense:  30 capsule    Refill:  0   ADHD (attention deficit hyperactivity disorder), inattentive type Patient stable.  Well controlled with current therapy.   Continue current meds.  Meds ordered this encounter  Medications   busPIRone  (BUSPAR ) 10 MG tablet    Sig: Take 1 tablet (10 mg total) by mouth 2 (two) times daily.    Dispense:  60 tablet    Refill:  1   lisdexamfetamine (VYVANSE ) 30 MG capsule    Sig: Take 1 capsule (30 mg total) by mouth daily.    Dispense:  30 capsule    Refill:  0   Mixed hyperlipidemia Continue current therapy for lipid control. Will modify as needed based on labwork results.      Return in about 3 months (around 10/10/2023) for F/U.   Total time spent: 20 minutes  ALAN CHRISTELLA ARRANT, FNP  07/10/2023   This document may have been prepared by [facility] Voice Recognition software and as such may include unintentional dictation errors.

## 2023-08-09 ENCOUNTER — Other Ambulatory Visit: Payer: Self-pay | Admitting: Family

## 2023-08-09 DIAGNOSIS — Z1231 Encounter for screening mammogram for malignant neoplasm of breast: Secondary | ICD-10-CM

## 2023-08-25 ENCOUNTER — Ambulatory Visit
Admission: RE | Admit: 2023-08-25 | Discharge: 2023-08-25 | Disposition: A | Discharge status: Home / Self Care | Source: Ambulatory Visit | Attending: Family | Admitting: Family

## 2023-08-25 DIAGNOSIS — Z1231 Encounter for screening mammogram for malignant neoplasm of breast: Secondary | ICD-10-CM | POA: Insufficient documentation

## 2023-08-27 ENCOUNTER — Encounter: Payer: Self-pay | Admitting: Family

## 2023-08-27 NOTE — Assessment & Plan Note (Signed)
 Continue current therapy for lipid control. Will modify as needed based on labwork results.

## 2023-08-27 NOTE — Assessment & Plan Note (Signed)
 Patient stable.  Well controlled with current therapy.   Continue current meds.  Meds ordered this encounter  Medications   busPIRone  (BUSPAR ) 10 MG tablet    Sig: Take 1 tablet (10 mg total) by mouth 2 (two) times daily.    Dispense:  60 tablet    Refill:  1   lisdexamfetamine (VYVANSE ) 30 MG capsule    Sig: Take 1 capsule (30 mg total) by mouth daily.    Dispense:  30 capsule    Refill:  0

## 2023-09-04 ENCOUNTER — Other Ambulatory Visit: Payer: Self-pay

## 2023-09-04 MED ORDER — LISDEXAMFETAMINE DIMESYLATE 30 MG PO CAPS: 30.0000 mg | ORAL_CAPSULE | Freq: Every day | ORAL | 0 refills | Status: DC

## 2023-09-11 ENCOUNTER — Other Ambulatory Visit: Payer: Self-pay | Admitting: Family

## 2023-09-11 DIAGNOSIS — F411 Generalized anxiety disorder: Secondary | ICD-10-CM

## 2023-10-10 ENCOUNTER — Ambulatory Visit (INDEPENDENT_AMBULATORY_CARE_PROVIDER_SITE_OTHER): Admitting: Family

## 2023-10-10 ENCOUNTER — Encounter: Payer: Self-pay | Admitting: Family

## 2023-10-10 DIAGNOSIS — E559 Vitamin D deficiency, unspecified: Secondary | ICD-10-CM | POA: Diagnosis not present

## 2023-10-10 DIAGNOSIS — E538 Deficiency of other specified B group vitamins: Secondary | ICD-10-CM

## 2023-10-10 DIAGNOSIS — R7303 Prediabetes: Secondary | ICD-10-CM | POA: Diagnosis not present

## 2023-10-10 DIAGNOSIS — I10 Essential (primary) hypertension: Secondary | ICD-10-CM | POA: Diagnosis not present

## 2023-10-10 DIAGNOSIS — E782 Mixed hyperlipidemia: Secondary | ICD-10-CM

## 2023-10-10 DIAGNOSIS — R5383 Other fatigue: Secondary | ICD-10-CM

## 2023-10-10 NOTE — Assessment & Plan Note (Signed)
 Checking labs today.  Continue current therapy for lipid control. Will modify as needed based on labwork results.   -CMP w/eGFR -Lipid Panel

## 2023-10-10 NOTE — Progress Notes (Signed)
 Established Patient Office Visit  Subjective:  Patient ID: Kara Mayer, female    DOB: Aug 31, 1965  Age: 58 y.o. MRN: [id number]  Chief Complaint  Patient presents with   Follow-up    3 month follow up    Patient is here today for her 2 months follow up.  She has been feeling fairly well since last appointment.   She does have additional concerns to discuss today.   Labs are due today.  She needs refills.   I have reviewed her active problem list, medication list, allergies, notes from last encounter, lab results for her appointment today.      No other concerns at this time.   Past Medical History:  Diagnosis Date   Anxiety    Depression    Osteoarthritis    Right knee & L-Spine    Past Surgical History:  Procedure Laterality Date   ABDOMINAL HYSTERECTOMY     BREAST REDUCTION SURGERY  11/01/2016   REDUCTION MAMMAPLASTY Bilateral 2018    Social History   Socioeconomic History   Marital status: Married    Spouse name: Not on file   Number of children: Not on file   Years of education: Not on file   Highest education level: Not on file  Occupational History   Not on file  Tobacco Use   Smoking status: Never   Smokeless tobacco: Never  Vaping Use   Vaping status: Never Used  Substance and Sexual Activity   Alcohol use: Yes    Alcohol/week: 0.0 standard drinks of alcohol    Comment: on weekends   Drug use: No   Sexual activity: Yes  Other Topics Concern   Not on file  Social History Narrative   Not on file   Social Drivers of Health   Financial Resource Strain: Not on file  Food Insecurity: Not on file  Transportation Needs: Not on file  Physical Activity: Not on file  Stress: Not on file  Social Connections: Not on file  Intimate Partner Violence: Not on file    Family History  Problem Relation Age of Onset   Hypertension Mother    Arthritis Mother    Suicidality Father    Hypertension Brother    Hyperlipidemia Brother    Asthma  Daughter    Diabetes Maternal Grandmother    Hypertension Maternal Grandmother    Heart disease Maternal Grandfather    Diabetes Paternal Grandmother    Breast cancer Neg Hx     No Known Allergies  Review of Systems  Psychiatric/Behavioral:  The patient is nervous/anxious.   All other systems reviewed and are negative.      Objective:   BP 120/84   Pulse 74   Ht 5' 1 (1.549 m)   Wt 127 lb 9.6 oz (57.9 kg)   SpO2 99%   BMI 24.11 kg/m   Vitals:   10/10/23 0935  BP: 120/84  Pulse: 74  Height: 5' 1 (1.549 m)  Weight: 127 lb 9.6 oz (57.9 kg)  SpO2: 99%  BMI (Calculated): 24.12    Physical Exam Vitals and nursing note reviewed.  Constitutional:      Appearance: Normal appearance. She is normal weight.  HENT:     Head: Normocephalic and atraumatic.  Eyes:     Extraocular Movements: Extraocular movements intact.     Conjunctiva/sclera: Conjunctivae normal.     Pupils: Pupils are equal, round, and reactive to light.  Cardiovascular:     Rate and Rhythm: Normal  rate.     Pulses: Normal pulses.  Pulmonary:     Effort: Pulmonary effort is normal.  Musculoskeletal:        General: Normal range of motion.     Cervical back: Normal range of motion.  Neurological:     General: No focal deficit present.     Mental Status: She is alert and oriented to person, place, and time. Mental status is at baseline.  Psychiatric:        Mood and Affect: Mood normal.        Behavior: Behavior normal.        Thought Content: Thought content normal.        Judgment: Judgment normal.      No results found for any visits on 10/10/23.  No results found for this or any previous visit (from the past 2160 hours).     Assessment & Plan Mixed hyperlipidemia Checking labs today.  Continue current therapy for lipid control. Will modify as needed based on labwork results.   -CMP w/eGFR -Lipid Panel  Prediabetes A1C Continues to be in prediabetic ranges.  Will reassess at  follow up after next lab check.  Patient counseled on dietary choices and verbalized understanding.   -CBC w/Diff -CMP w/eGFR -Hemoglobin A1C  Vitamin D  deficiency, unspecified B12 deficiency due to diet Other fatigue Checking labs today.  Will continue supplements as needed.   - Vitamin D  - Vitamin B12 - TSH  Essential hypertension, benign Blood pressure well controlled with current medications.  Continue current therapy.  Will reassess at follow up.   - CBC w/Diff - CMP w/eGFR    Return in about 3 months (around 01/10/2024).   Total time spent: 20 minutes  ALAN CHRISTELLA ARRANT, FNP  10/10/2023   This document may have been prepared by [facility] Voice Recognition software and as such may include unintentional dictation errors.

## 2023-10-11 LAB — CBC WITH DIFFERENTIAL/PLATELET
Basophils Absolute: 0.1 x10E3/uL (ref 0.0–0.2)
Basos: 1 %
EOS (ABSOLUTE): 0.2 x10E3/uL (ref 0.0–0.4)
Eos: 3 %
Hematocrit: 39.6 % (ref 34.0–46.6)
Hemoglobin: 12.4 g/dL (ref 11.1–15.9)
Immature Grans (Abs): 0 x10E3/uL (ref 0.0–0.1)
Immature Granulocytes: 0 %
Lymphocytes Absolute: 3.3 x10E3/uL — ABNORMAL HIGH (ref 0.7–3.1)
Lymphs: 38 %
MCH: 26.7 pg (ref 26.6–33.0)
MCHC: 31.3 g/dL — ABNORMAL LOW (ref 31.5–35.7)
MCV: 85 fL (ref 79–97)
Monocytes Absolute: 0.5 x10E3/uL (ref 0.1–0.9)
Monocytes: 5 %
Neutrophils Absolute: 4.6 x10E3/uL (ref 1.4–7.0)
Neutrophils: 53 %
Platelets: 433 x10E3/uL (ref 150–450)
RBC: 4.65 x10E6/uL (ref 3.77–5.28)
RDW: 12.7 % (ref 11.7–15.4)
WBC: 8.6 x10E3/uL (ref 3.4–10.8)

## 2023-10-11 LAB — CMP14+EGFR
ALT: 15 [IU]/L (ref 0–32)
AST: 16 [IU]/L (ref 0–40)
Albumin: 4.7 g/dL (ref 3.8–4.9)
Alkaline Phosphatase: 68 [IU]/L (ref 44–121)
BUN/Creatinine Ratio: 10 (ref 9–23)
BUN: 9 mg/dL (ref 6–24)
Bilirubin Total: 0.4 mg/dL (ref 0.0–1.2)
CO2: 23 mmol/L (ref 20–29)
Calcium: 9.7 mg/dL (ref 8.7–10.2)
Chloride: 104 mmol/L (ref 96–106)
Creatinine, Ser: 0.86 mg/dL (ref 0.57–1.00)
Globulin, Total: 2.7 g/dL (ref 1.5–4.5)
Glucose: 109 mg/dL — ABNORMAL HIGH (ref 70–99)
Potassium: 4.2 mmol/L (ref 3.5–5.2)
Sodium: 142 mmol/L (ref 134–144)
Total Protein: 7.4 g/dL (ref 6.0–8.5)
eGFR: 78 mL/min/{1.73_m2}

## 2023-10-11 LAB — LIPID PANEL
Chol/HDL Ratio: 2.5 ratio (ref 0.0–4.4)
Cholesterol, Total: 201 mg/dL — ABNORMAL HIGH (ref 100–199)
HDL: 82 mg/dL (ref >39)
LDL Chol Calc (NIH): 110 mg/dL — ABNORMAL HIGH (ref 0–99)
Triglycerides: 50 mg/dL (ref 0–149)
VLDL Cholesterol Cal: 9 mg/dL (ref 5–40)

## 2023-10-11 LAB — TSH: TSH: 1.44 u[IU]/mL (ref 0.450–4.500)

## 2023-10-11 LAB — VITAMIN B12: Vitamin B-12: 593 pg/mL (ref 232–1245)

## 2023-10-11 LAB — HEMOGLOBIN A1C
Est. average glucose Bld gHb Est-mCnc: 117 mg/dL
Hgb A1c MFr Bld: 5.7 % — ABNORMAL HIGH (ref 4.8–5.6)

## 2023-10-11 LAB — VITAMIN D 25 HYDROXY (VIT D DEFICIENCY, FRACTURES): Vit D, 25-Hydroxy: 32.9 ng/mL (ref 30.0–100.0)

## 2023-10-13 ENCOUNTER — Other Ambulatory Visit: Payer: Self-pay

## 2023-10-17 MED ORDER — LISDEXAMFETAMINE DIMESYLATE 30 MG PO CAPS: 30.0000 mg | ORAL_CAPSULE | Freq: Every day | ORAL | 0 refills | Status: DC

## 2023-10-23 LAB — COLOGUARD: COLOGUARD: NEGATIVE

## 2023-10-24 ENCOUNTER — Ambulatory Visit: Payer: Self-pay | Admitting: Cardiology

## 2023-10-24 NOTE — Progress Notes (Signed)
 Patient notified

## 2023-11-07 ENCOUNTER — Other Ambulatory Visit: Payer: Self-pay | Admitting: Family

## 2023-11-07 DIAGNOSIS — F411 Generalized anxiety disorder: Secondary | ICD-10-CM

## 2023-11-17 ENCOUNTER — Ambulatory Visit: Payer: Self-pay

## 2023-11-22 ENCOUNTER — Other Ambulatory Visit: Payer: Self-pay

## 2023-11-22 MED ORDER — LISDEXAMFETAMINE DIMESYLATE 30 MG PO CAPS: 30.0000 mg | ORAL_CAPSULE | Freq: Every day | ORAL | 0 refills | Status: DC

## 2024-01-04 ENCOUNTER — Other Ambulatory Visit: Payer: Self-pay

## 2024-01-04 MED ORDER — LISDEXAMFETAMINE DIMESYLATE 30 MG PO CAPS: 30.0000 mg | ORAL_CAPSULE | Freq: Every day | ORAL | 0 refills | Status: DC

## 2024-01-10 ENCOUNTER — Ambulatory Visit (INDEPENDENT_AMBULATORY_CARE_PROVIDER_SITE_OTHER): Admitting: Family

## 2024-01-10 ENCOUNTER — Encounter: Payer: Self-pay | Admitting: Family

## 2024-01-10 VITALS — BP 122/70 | HR 68 | Ht 61.0 in | Wt 129.0 lb

## 2024-01-10 DIAGNOSIS — I10 Essential (primary) hypertension: Secondary | ICD-10-CM | POA: Insufficient documentation

## 2024-01-10 DIAGNOSIS — R5383 Other fatigue: Secondary | ICD-10-CM | POA: Insufficient documentation

## 2024-01-10 DIAGNOSIS — R7303 Prediabetes: Secondary | ICD-10-CM | POA: Diagnosis not present

## 2024-01-10 DIAGNOSIS — Z79899 Other long term (current) drug therapy: Secondary | ICD-10-CM | POA: Insufficient documentation

## 2024-01-10 DIAGNOSIS — M19042 Primary osteoarthritis, left hand: Secondary | ICD-10-CM

## 2024-01-10 DIAGNOSIS — E538 Deficiency of other specified B group vitamins: Secondary | ICD-10-CM | POA: Insufficient documentation

## 2024-01-10 DIAGNOSIS — E559 Vitamin D deficiency, unspecified: Secondary | ICD-10-CM | POA: Diagnosis not present

## 2024-01-10 DIAGNOSIS — E782 Mixed hyperlipidemia: Secondary | ICD-10-CM | POA: Diagnosis not present

## 2024-01-10 DIAGNOSIS — M19041 Primary osteoarthritis, right hand: Secondary | ICD-10-CM

## 2024-01-10 MED ORDER — MELOXICAM 7.5 MG PO TABS: 7.5000 mg | ORAL_TABLET | Freq: Every day | ORAL | 2 refills | Status: DC

## 2024-01-10 NOTE — Assessment & Plan Note (Addendum)
-   Continue healthy diet and exercise as tolerated. - Stable without medication - Check labs today

## 2024-01-10 NOTE — Assessment & Plan Note (Addendum)
-   Check labs today - Supplementation recommended based off lab results and will notify patient at that time

## 2024-01-10 NOTE — Progress Notes (Signed)
 Established Patient Office Visit  Subjective:  Patient ID: Kara Mayer, female    DOB: 03-16-1965  Age: 58 y.o. MRN: [id number]  Chief Complaint  Patient presents with   Follow-up    3 month follow up    Patient is here today for her 3 months follow up.  She has been feeling well since last appointment.   She does have additional concerns to discuss today. She reports her anxiety has improved so she has stopped taking her medications. Updated PHQ-9 score GAD-7 score [facility] is monitoring her osteoarthritis and reports her joints do hurt. She reports she used to take Meloxicam as needed but she stopped it because she had been feeling better. Will order Meloxicam 7.5 mg to take as needed for joint pain. Labs are due today.  She does not need refills.   I have reviewed her active problem list, medication list, allergies, family history, social history, health maintenance, notes from last encounter, lab results for her appointment today.    She reports she already received her flu shot for the season through her employer.    No other concerns at this time.   Past Medical History:  Diagnosis Date   Anxiety    Depression    Osteoarthritis    Right knee & L-Spine    Past Surgical History:  Procedure Laterality Date   ABDOMINAL HYSTERECTOMY     BREAST REDUCTION SURGERY  11/01/2016   REDUCTION MAMMAPLASTY Bilateral 2018    Social History   Socioeconomic History   Marital status: Married    Spouse name: Not on file   Number of children: Not on file   Years of education: Not on file   Highest education level: Not on file  Occupational History   Not on file  Tobacco Use   Smoking status: Never   Smokeless tobacco: Never  Vaping Use   Vaping status: Never Used  Substance and Sexual Activity   Alcohol use: Yes    Alcohol/week: 0.0 standard drinks of alcohol    Comment: on weekends   Drug use: No   Sexual activity: Yes  Other Topics Concern   Not on file   Social History Narrative   Not on file   Social Drivers of Health   Financial Resource Strain: Not on file  Food Insecurity: Not on file  Transportation Needs: Not on file  Physical Activity: Not on file  Stress: Not on file  Social Connections: Not on file  Intimate Partner Violence: Not on file    Family History  Problem Relation Age of Onset   Hypertension Mother    Arthritis Mother    Suicidality Father    Hypertension Brother    Hyperlipidemia Brother    Asthma Daughter    Diabetes Maternal Grandmother    Hypertension Maternal Grandmother    Heart disease Maternal Grandfather    Diabetes Paternal Grandmother    Breast cancer Neg Hx     No Known Allergies  Review of Systems  Constitutional:  Negative for malaise/fatigue.  HENT: Negative.    Eyes:  Negative for blurred vision and pain.  Respiratory:  Negative for cough and shortness of breath.   Cardiovascular:  Negative for chest pain, palpitations, claudication and leg swelling.  Gastrointestinal:  Negative for abdominal pain, blood in stool, constipation, diarrhea, nausea and vomiting.  Genitourinary:  Negative for dysuria, frequency and urgency.  Musculoskeletal:  Positive for joint pain (osteoarthritis both hands).  Skin: Negative.   Neurological:  Negative for dizziness, tingling, sensory change and headaches.  Endo/Heme/Allergies: Negative.   Psychiatric/Behavioral: Negative.         Objective:   BP 122/70   Pulse 68   Ht 5' 1 (1.549 m)   Wt 129 lb (58.5 kg)   SpO2 98%   BMI 24.37 kg/m   Vitals:   01/10/24 0915  BP: 122/70  Pulse: 68  Height: 5' 1 (1.549 m)  Weight: 129 lb (58.5 kg)  SpO2: 98%  BMI (Calculated): 24.39    Physical Exam Vitals and nursing note reviewed.  Constitutional:      Appearance: Normal appearance.  HENT:     Head: Normocephalic.  Eyes:     Extraocular Movements: Extraocular movements intact.     Pupils: Pupils are equal, round, and reactive to light.   Cardiovascular:     Rate and Rhythm: Normal rate and regular rhythm.     Pulses: Normal pulses.     Heart sounds: Normal heart sounds. No murmur heard. Pulmonary:     Effort: Pulmonary effort is normal. No respiratory distress.     Breath sounds: Normal breath sounds.  Abdominal:     General: There is no distension.     Tenderness: There is no abdominal tenderness.  Musculoskeletal:        General: No tenderness. Normal range of motion.     Cervical back: Normal range of motion and neck supple.     Right lower leg: No edema.     Left lower leg: No edema.  Skin:    General: Skin is warm and dry.     Coloration: Skin is not jaundiced.     Findings: No erythema.  Neurological:     General: No focal deficit present.     Mental Status: She is alert and oriented to person, place, and time.  Psychiatric:        Mood and Affect: Mood normal.        Speech: Speech normal.        Behavior: Behavior is cooperative.        Cognition and Memory: Memory is not impaired.      No results found for any visits on 01/10/24.  Recent Results (from the past 2160 hours)  Cologuard     Status: None   Collection Time: 10/15/23  7:00 AM  Result Value Ref Range   COLOGUARD Negative Negative    Comment: The Cologuard (TM) test was performed on this specimen.  NEGATIVE TEST RESULT. A negative Cologuard result indicates a low likelihood that a colorectal cancer (CRC) or advanced adenoma (adenomatous polyps with more advanced pre-malignant features) is present. The chance that a person with a negative Cologuard test has a colorectal cancer is less than 1 in 1500 (negative predictive value >99.9%) or has an advanced adenoma is less than 5.3% (negative predictive value 94.7%). These data are based on a prospective cross-sectional study of 10,000 individuals at average risk for colorectal cancer who were screened with both Cologuard and colonoscopy. (Imperiale T. et al, N Engl J Med 2014;370(14):1286-1297)  The normal value (reference range) for this assay is negative.  COLOGUARD RE-SCREENING RECOMMENDATION: Periodic colorectal cancer screening is an important part of preventive healthcare for asymptomatic individuals at average risk for colorectal cancer. Following a negative Cologuard  result, the American Cancer Society and U.S. Multi-Society Task Force screening guidelines recommend a Cologuard re-screening interval of 3 years.  References: American Cancer Society Guideline for Colorectal Cancer Screening: https://www.cancer.org/cancer/colon-rectal-cancer/detection-diagnosis-staging/acs-recommendations.html.; Rex DK,  Boland CR, Dominitz JK, Colorectal Cancer Screening: Recommendations for Physicians and Patients from the U.S. Multi-Society Task Force on Colorectal Cancer Screening , Am J Gastroenterology 2017; 112:1016-1030.  TEST DESCRIPTION: Composite algorithmic analysis of stool DNA-biomarkers with hemoglobin immunoassay.   Quantitative values of individual biomarkers are not reportable and are not associated with individual biomarker result reference ranges. Cologuard is intended for colorectal cancer screening of adults of either sex, 45 years or older, who are at average-risk for colorectal cancer (CRC). Cologuard has been approved for use by the [place]  FDA. The performance of Cologuard was established in a cross sectional study of average-risk adults aged 14-84. Cologuard performance in patients ages 61 to 39 years was estimated by sub-group analysis of near-age groups. Colonoscopies performed for a positive result may find as the most clinically significant lesion: colorectal cancer [4.0%], advanced adenoma (including sessile serrated polyps greater than or equal to 1cm diameter) [20%] or non- advanced adenoma [31%]; or no colorectal neoplasia [45%]. These estimates are derived from a prospective cross-sectional screening study of 10,000 individuals at average risk for colorectal cancer who were  screened with both Cologuard and colonoscopy. (Imperiale T. et al, LOISE [place] J Med 2014;370(14):1286-1297.) Cologuard may produce a false negative or false positive result (no colorectal cancer or precancerous polyp present at colonoscopy follow up). A negative Cologuard test result does not guarantee the absence of CRC or advanced adenoma  (pre-cancer). The current Cologuard screening interval is every 3 years. Science Writer and [place] Therapist, [employer]). Cologuard performance data in a 10,000 patient pivotal study using colonoscopy as the reference method can be accessed at the following location: www.exactlabs.com/results. Additional description of the Cologuard test process, warnings and precautions can be found at www.cologuard.com.        Assessment & Plan Essential hypertension, benign Mixed hyperlipidemia Prediabetes - Continue healthy diet and exercise as tolerated. - Stable without medication - Check labs today   Vitamin D  deficiency, unspecified B12 deficiency due to diet Other fatigue - Check labs today - Supplementation recommended based off lab results and will notify patient at that time   Primary osteoarthritis of both hands - Start Meloxicam 7.5 mg as needed for arthritis pain. Can increase to 15 mg daily.  Long term current use of therapeutic drug - Urine tox sure collected - Refill medication as needed.   Return in about 3 months (around 04/11/2024).   Total time spent: 25 minutes  Oddis DELENA Cain, FNP  01/10/2024   This document may have been prepared by [facility] Voice Recognition software and as such may include unintentional dictation errors.

## 2024-01-10 NOTE — Assessment & Plan Note (Addendum)
-   Start Meloxicam 7.5 mg as needed for arthritis pain. Can increase to 15 mg daily.

## 2024-01-10 NOTE — Assessment & Plan Note (Addendum)
-   Urine tox sure collected - Refill medication as needed.

## 2024-01-11 ENCOUNTER — Ambulatory Visit: Payer: Self-pay

## 2024-01-11 DIAGNOSIS — E782 Mixed hyperlipidemia: Secondary | ICD-10-CM

## 2024-01-11 LAB — CBC WITH DIFFERENTIAL/PLATELET
Basophils Absolute: 0.1 x10E3/uL (ref 0.0–0.2)
Basos: 1 %
EOS (ABSOLUTE): 0.3 x10E3/uL (ref 0.0–0.4)
Eos: 4 %
Hematocrit: 38.8 % (ref 34.0–46.6)
Hemoglobin: 12.4 g/dL (ref 11.1–15.9)
Immature Grans (Abs): 0 x10E3/uL (ref 0.0–0.1)
Immature Granulocytes: 0 %
Lymphocytes Absolute: 3.3 x10E3/uL — ABNORMAL HIGH (ref 0.7–3.1)
Lymphs: 36 %
MCH: 26.7 pg (ref 26.6–33.0)
MCHC: 32 g/dL (ref 31.5–35.7)
MCV: 84 fL (ref 79–97)
Monocytes Absolute: 0.5 x10E3/uL (ref 0.1–0.9)
Monocytes: 6 %
Neutrophils Absolute: 4.8 x10E3/uL (ref 1.4–7.0)
Neutrophils: 53 %
Platelets: 428 x10E3/uL (ref 150–450)
RBC: 4.64 x10E6/uL (ref 3.77–5.28)
RDW: 12.4 % (ref 11.7–15.4)
WBC: 9 x10E3/uL (ref 3.4–10.8)

## 2024-01-11 LAB — VITAMIN B12: Vitamin B-12: 946 pg/mL (ref 232–1245)

## 2024-01-11 LAB — LIPID PANEL
Chol/HDL Ratio: 2.6 ratio (ref 0.0–4.4)
Cholesterol, Total: 202 mg/dL — ABNORMAL HIGH (ref 100–199)
HDL: 77 mg/dL (ref >39)
LDL Chol Calc (NIH): 117 mg/dL — ABNORMAL HIGH (ref 0–99)
Triglycerides: 43 mg/dL (ref 0–149)
VLDL Cholesterol Cal: 8 mg/dL (ref 5–40)

## 2024-01-11 LAB — CMP14+EGFR
ALT: 11 IU/L (ref 0–32)
AST: 16 IU/L (ref 0–40)
Albumin: 4.7 g/dL (ref 3.8–4.9)
Alkaline Phosphatase: 59 IU/L (ref 49–135)
BUN/Creatinine Ratio: 15 (ref 9–23)
BUN: 13 mg/dL (ref 6–24)
Bilirubin Total: 0.3 mg/dL (ref 0.0–1.2)
CO2: 22 mmol/L (ref 20–29)
Calcium: 9.9 mg/dL (ref 8.7–10.2)
Chloride: 100 mmol/L (ref 96–106)
Creatinine, Ser: 0.87 mg/dL (ref 0.57–1.00)
Globulin, Total: 2.5 g/dL (ref 1.5–4.5)
Glucose: 88 mg/dL (ref 70–99)
Potassium: 4.3 mmol/L (ref 3.5–5.2)
Sodium: 139 mmol/L (ref 134–144)
Total Protein: 7.2 g/dL (ref 6.0–8.5)
eGFR: 77 mL/min/1.73 (ref >59)

## 2024-01-11 LAB — VITAMIN D 25 HYDROXY (VIT D DEFICIENCY, FRACTURES): Vit D, 25-Hydroxy: 35.3 ng/mL (ref 30.0–100.0)

## 2024-01-11 LAB — HEMOGLOBIN A1C
Est. average glucose Bld gHb Est-mCnc: 123 mg/dL
Hgb A1c MFr Bld: 5.9 % — ABNORMAL HIGH (ref 4.8–5.6)

## 2024-01-11 LAB — TSH: TSH: 1.7 u[IU]/mL (ref 0.450–4.500)

## 2024-01-11 MED ORDER — ROSUVASTATIN CALCIUM 10 MG PO TABS: 10.0000 mg | ORAL_TABLET | Freq: Every day | ORAL | 11 refills | Status: AC

## 2024-01-13 LAB — TOXASSURE SELECT,+ANTIDEPR,UR

## 2024-01-16 NOTE — Progress Notes (Signed)
 Patient notified

## 2024-01-17 ENCOUNTER — Other Ambulatory Visit: Payer: Self-pay | Admitting: Family

## 2024-01-17 DIAGNOSIS — F411 Generalized anxiety disorder: Secondary | ICD-10-CM

## 2024-02-14 ENCOUNTER — Other Ambulatory Visit: Payer: Self-pay

## 2024-02-14 MED ORDER — LISDEXAMFETAMINE DIMESYLATE 30 MG PO CAPS: 30.0000 mg | ORAL_CAPSULE | Freq: Every day | ORAL | 0 refills | Status: DC

## 2024-03-14 ENCOUNTER — Other Ambulatory Visit: Payer: Self-pay

## 2024-03-14 DIAGNOSIS — M19041 Primary osteoarthritis, right hand: Secondary | ICD-10-CM

## 2024-03-20 ENCOUNTER — Other Ambulatory Visit: Payer: Self-pay | Admitting: Family

## 2024-03-20 DIAGNOSIS — F411 Generalized anxiety disorder: Secondary | ICD-10-CM

## 2024-03-25 ENCOUNTER — Other Ambulatory Visit: Payer: Self-pay

## 2024-03-26 MED ORDER — LISDEXAMFETAMINE DIMESYLATE 30 MG PO CAPS: 30.0000 mg | ORAL_CAPSULE | Freq: Every day | ORAL | 0 refills | Status: AC

## 2024-04-04 ENCOUNTER — Other Ambulatory Visit

## 2024-04-04 DIAGNOSIS — I10 Essential (primary) hypertension: Secondary | ICD-10-CM

## 2024-04-04 DIAGNOSIS — E782 Mixed hyperlipidemia: Secondary | ICD-10-CM

## 2024-04-05 ENCOUNTER — Ambulatory Visit: Payer: Self-pay

## 2024-04-05 ENCOUNTER — Encounter: Payer: Self-pay | Admitting: Family

## 2024-04-05 LAB — CMP14+EGFR
ALT: 41 [IU]/L — ABNORMAL HIGH (ref 0–32)
AST: 34 [IU]/L (ref 0–40)
Albumin: 4.4 g/dL (ref 3.8–4.9)
Alkaline Phosphatase: 57 [IU]/L (ref 49–135)
BUN/Creatinine Ratio: 12 (ref 9–23)
BUN: 10 mg/dL (ref 6–24)
Bilirubin Total: 0.4 mg/dL (ref 0.0–1.2)
CO2: 24 mmol/L (ref 20–29)
Calcium: 9.4 mg/dL (ref 8.7–10.2)
Chloride: 102 mmol/L (ref 96–106)
Creatinine, Ser: 0.85 mg/dL (ref 0.57–1.00)
Globulin, Total: 2.3 g/dL (ref 1.5–4.5)
Glucose: 85 mg/dL (ref 70–99)
Potassium: 4.3 mmol/L (ref 3.5–5.2)
Sodium: 140 mmol/L (ref 134–144)
Total Protein: 6.7 g/dL (ref 6.0–8.5)
eGFR: 79 mL/min/{1.73_m2}

## 2024-04-05 LAB — CBC WITH DIFFERENTIAL/PLATELET
Basophils Absolute: 0.1 10*3/uL (ref 0.0–0.2)
Basos: 1 %
EOS (ABSOLUTE): 0.3 10*3/uL (ref 0.0–0.4)
Eos: 4 %
Hematocrit: 36.3 % (ref 34.0–46.6)
Hemoglobin: 11 g/dL — ABNORMAL LOW (ref 11.1–15.9)
Immature Grans (Abs): 0 10*3/uL (ref 0.0–0.1)
Immature Granulocytes: 0 %
Lymphocytes Absolute: 2.5 10*3/uL (ref 0.7–3.1)
Lymphs: 38 %
MCH: 26.1 pg — ABNORMAL LOW (ref 26.6–33.0)
MCHC: 30.3 g/dL — ABNORMAL LOW (ref 31.5–35.7)
MCV: 86 fL (ref 79–97)
Monocytes Absolute: 0.4 10*3/uL (ref 0.1–0.9)
Monocytes: 7 %
Neutrophils Absolute: 3.3 10*3/uL (ref 1.4–7.0)
Neutrophils: 50 %
Platelets: 375 10*3/uL (ref 150–450)
RBC: 4.22 x10E6/uL (ref 3.77–5.28)
RDW: 13 % (ref 11.7–15.4)
WBC: 6.6 10*3/uL (ref 3.4–10.8)

## 2024-04-11 ENCOUNTER — Encounter: Payer: Self-pay | Admitting: Family

## 2024-04-11 ENCOUNTER — Ambulatory Visit: Admitting: Family

## 2024-07-09 ENCOUNTER — Ambulatory Visit: Admitting: Family
